# Patient Record
Sex: Male | Born: 1980 | Race: Asian | Hispanic: No | Marital: Married | Smoking: Former smoker
Health system: Southern US, Community
[De-identification: ages and names within clinical notes are randomized; demographics above are authoritative.]

## PROBLEM LIST (undated history)

## (undated) DIAGNOSIS — F1120 Opioid dependence, uncomplicated: Secondary | ICD-10-CM

## (undated) DIAGNOSIS — F1110 Opioid abuse, uncomplicated: Secondary | ICD-10-CM

## (undated) HISTORY — PX: TIBIA FRACTURE SURGERY: SHX806

---

## 2000-02-15 ENCOUNTER — Inpatient Hospital Stay (HOSPITAL_COMMUNITY): Admission: EM | Admit: 2000-02-15 | Discharge: 2000-03-02 | Payer: Self-pay

## 2000-02-15 ENCOUNTER — Encounter: Payer: Self-pay | Admitting: General Surgery

## 2000-02-16 ENCOUNTER — Encounter: Payer: Self-pay | Admitting: General Surgery

## 2000-02-18 ENCOUNTER — Encounter: Payer: Self-pay | Admitting: Anesthesiology

## 2000-06-04 ENCOUNTER — Ambulatory Visit (HOSPITAL_BASED_OUTPATIENT_CLINIC_OR_DEPARTMENT_OTHER): Admission: RE | Admit: 2000-06-04 | Discharge: 2000-06-04 | Payer: Self-pay | Admitting: Orthopedic Surgery

## 2000-09-03 ENCOUNTER — Inpatient Hospital Stay (HOSPITAL_COMMUNITY): Admission: RE | Admit: 2000-09-03 | Discharge: 2000-09-05 | Payer: Self-pay | Admitting: Orthopedic Surgery

## 2000-09-03 ENCOUNTER — Encounter: Payer: Self-pay | Admitting: Orthopedic Surgery

## 2005-04-03 HISTORY — PX: INGUINAL HERNIA REPAIR: SHX194

## 2005-06-29 ENCOUNTER — Emergency Department (HOSPITAL_COMMUNITY): Admission: EM | Admit: 2005-06-29 | Discharge: 2005-06-29 | Payer: Self-pay | Admitting: Emergency Medicine

## 2008-11-28 ENCOUNTER — Encounter: Admission: RE | Admit: 2008-11-28 | Discharge: 2008-11-28 | Payer: Self-pay | Admitting: Family Medicine

## 2008-12-21 ENCOUNTER — Ambulatory Visit (HOSPITAL_BASED_OUTPATIENT_CLINIC_OR_DEPARTMENT_OTHER): Admission: RE | Admit: 2008-12-21 | Discharge: 2008-12-21 | Payer: Self-pay | Admitting: Specialist

## 2010-11-11 LAB — POCT HEMOGLOBIN-HEMACUE: Hemoglobin: 15.6 g/dL (ref 13.0–17.0)
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
## 2011-09-17 ENCOUNTER — Encounter (HOSPITAL_COMMUNITY): Payer: Self-pay | Admitting: *Deleted

## 2011-09-17 ENCOUNTER — Emergency Department (HOSPITAL_COMMUNITY)
Admission: EM | Admit: 2011-09-17 | Discharge: 2011-09-17 | Disposition: A | Discharge status: Home / Self Care | Payer: Medicaid Other | Source: Home / Self Care | Attending: Family Medicine | Admitting: Family Medicine

## 2011-09-17 ENCOUNTER — Emergency Department (INDEPENDENT_AMBULATORY_CARE_PROVIDER_SITE_OTHER): Payer: Medicaid Other

## 2011-09-17 DIAGNOSIS — G8929 Other chronic pain: Secondary | ICD-10-CM

## 2011-09-17 DIAGNOSIS — M25569 Pain in unspecified knee: Secondary | ICD-10-CM

## 2011-09-17 HISTORY — DX: Rider (driver) (passenger) of other motorcycle injured in unspecified traffic accident, initial encounter: V29.99XA

## 2011-09-17 MED ORDER — OXYCODONE-ACETAMINOPHEN 5-325 MG PO TABS: ORAL_TABLET | ORAL | Status: AC
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
## 2011-09-17 NOTE — Discharge Instructions (Signed)
Take medications as directed. Please call [facility] to establish care with a primary care provider. Inform them of your insurance carrier Medicaid. Ask for a list of providers accept Medicaid. Also, I provided you with 2 podiatry offices. You may call them for further evaluation of the callus on your foot. First, you may try an over-the-counter product called Medi-plast. This is used to treat warts and calluses. It may take several weeks for the callus to completely resolve. However if this does not work, then you may seek evaluation at one of the podiatry offices. Again, these may take several weeks to resolve. They may use a variety of methods, such as freezing or shaving. You should also make an appointment with your previous orthopedic provider. Return to care should your symptoms not improve, or worsen in any way.
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
## 2011-11-23 ENCOUNTER — Other Ambulatory Visit: Payer: Self-pay | Admitting: Family Medicine

## 2011-11-23 ENCOUNTER — Ambulatory Visit
Admission: RE | Admit: 2011-11-23 | Discharge: 2011-11-23 | Disposition: A | Discharge status: Home / Self Care | Payer: Medicaid Other | Source: Ambulatory Visit | Attending: Family Medicine | Admitting: Family Medicine

## 2011-11-23 DIAGNOSIS — R52 Pain, unspecified: Secondary | ICD-10-CM

## 2011-11-23 DIAGNOSIS — R609 Edema, unspecified: Secondary | ICD-10-CM

## 2012-10-13 ENCOUNTER — Emergency Department (HOSPITAL_COMMUNITY)
Admission: EM | Admit: 2012-10-13 | Discharge: 2012-10-13 | Disposition: A | Discharge status: Home / Self Care | Payer: Medicaid Other | Attending: Emergency Medicine | Admitting: Emergency Medicine

## 2012-10-13 ENCOUNTER — Encounter (HOSPITAL_COMMUNITY): Payer: Self-pay | Admitting: *Deleted

## 2012-10-13 ENCOUNTER — Emergency Department (HOSPITAL_COMMUNITY): Payer: Medicaid Other

## 2012-10-13 DIAGNOSIS — Z87828 Personal history of other (healed) physical injury and trauma: Secondary | ICD-10-CM | POA: Insufficient documentation

## 2012-10-13 DIAGNOSIS — R51 Headache: Secondary | ICD-10-CM | POA: Insufficient documentation

## 2012-10-13 DIAGNOSIS — F17200 Nicotine dependence, unspecified, uncomplicated: Secondary | ICD-10-CM | POA: Insufficient documentation

## 2012-10-13 LAB — CBC WITH DIFFERENTIAL/PLATELET
Basophils Absolute: 0 K/uL (ref 0.0–0.1)
Basophils Relative: 0 % (ref 0–1)
Eosinophils Absolute: 0.2 K/uL (ref 0.0–0.7)
Eosinophils Relative: 2 % (ref 0–5)
HCT: 42.1 % (ref 39.0–52.0)
Hemoglobin: 14.4 g/dL (ref 13.0–17.0)
Lymphocytes Relative: 21 % (ref 12–46)
Lymphs Abs: 1.6 K/uL (ref 0.7–4.0)
MCH: 30.1 pg (ref 26.0–34.0)
MCHC: 34.2 g/dL (ref 30.0–36.0)
MCV: 88.1 fL (ref 78.0–100.0)
Monocytes Absolute: 0.3 K/uL (ref 0.1–1.0)
Monocytes Relative: 4 % (ref 3–12)
Neutro Abs: 5.5 K/uL (ref 1.7–7.7)
Neutrophils Relative %: 73 % (ref 43–77)
Platelets: 181 K/uL (ref 150–400)
RBC: 4.78 MIL/uL (ref 4.22–5.81)
RDW: 12.2 % (ref 11.5–15.5)
WBC: 7.5 K/uL (ref 4.0–10.5)

## 2012-10-13 LAB — SEDIMENTATION RATE: Sed Rate: 1 mm/hr (ref 0–16)

## 2012-10-13 LAB — POCT I-STAT, CHEM 8
HCT: 44 % (ref 39.0–52.0)
Hemoglobin: 15 g/dL (ref 13.0–17.0)
Potassium: 3.7 mEq/L (ref 3.5–5.1)
Sodium: 139 mEq/L (ref 135–145)

## 2012-10-13 MED ORDER — CARBAMAZEPINE ER 100 MG PO CP12: ORAL_CAPSULE | ORAL | Status: DC

## 2012-10-13 MED ORDER — KETOROLAC TROMETHAMINE 60 MG/2ML IM SOLN
60.0000 mg | Freq: Once | INTRAMUSCULAR | Status: AC
  Administered 2012-10-13: 60 mg via INTRAMUSCULAR
  Filled 2012-10-13: qty 2

## 2012-10-13 MED ORDER — METOCLOPRAMIDE HCL 5 MG/ML IJ SOLN
10.0000 mg | Freq: Once | INTRAMUSCULAR | Status: AC
  Administered 2012-10-13: 10 mg via INTRAVENOUS
  Filled 2012-10-13: qty 2

## 2012-10-13 MED ORDER — DIPHENHYDRAMINE HCL 50 MG/ML IJ SOLN
25.0000 mg | Freq: Once | INTRAMUSCULAR | Status: AC
  Administered 2012-10-13: 25 mg via INTRAVENOUS
  Filled 2012-10-13: qty 1

## 2012-10-13 MED ORDER — IOHEXOL 350 MG/ML SOLN
100.0000 mL | Freq: Once | INTRAVENOUS | Status: AC | PRN
  Administered 2012-10-13: 100 mL via INTRAVENOUS

## 2012-10-13 MED ORDER — CARBAMAZEPINE 200 MG PO TABS
200.0000 mg | ORAL_TABLET | Freq: Once | ORAL | Status: AC
  Administered 2012-10-13: 200 mg via ORAL
  Filled 2012-10-13: qty 1

## 2012-10-13 MED ORDER — OXYCODONE-ACETAMINOPHEN 10-325 MG PO TABS: 1.0000 | ORAL_TABLET | ORAL | Status: DC | PRN

## 2012-10-13 MED ORDER — OXYCODONE-ACETAMINOPHEN 5-325 MG PO TABS
1.0000 | ORAL_TABLET | Freq: Once | ORAL | Status: AC
  Administered 2012-10-13: 1 via ORAL
  Filled 2012-10-13: qty 1

## 2012-10-13 MED ORDER — DEXAMETHASONE SODIUM PHOSPHATE 10 MG/ML IJ SOLN
10.0000 mg | Freq: Once | INTRAMUSCULAR | Status: AC
  Administered 2012-10-13: 10 mg via INTRAVENOUS
  Filled 2012-10-13: qty 1

## 2012-10-13 MED ORDER — HYDROMORPHONE HCL PF 1 MG/ML IJ SOLN
1.0000 mg | Freq: Once | INTRAMUSCULAR | Status: AC
  Administered 2012-10-13: 1 mg via INTRAVENOUS
  Filled 2012-10-13: qty 1

## 2012-10-13 NOTE — ED Notes (Signed)
Pt c/o headache x 2 days; tonight pain right side of face below eye and right cheek area; equal facial grimacing; no numbness

## 2012-10-13 NOTE — Discharge Instructions (Signed)
You Have been seen today for your complaint of right-sided facial pain.  He will likely have a condition called trigeminal neuralgia.  Information is attached.  Please take the medications I have prescribed for you as directed.  He'll need to followup next week with your primary care doctor.  you will also need repeat lab work to look at your blood cell counts (a cbc w/ differential). Did not take Tylenol with the pain medication I prescribed you.  You should not drive or operate heavy machinery.  Do not combine it with alcohol.
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
## 2012-10-13 NOTE — ED Provider Notes (Addendum)
6:10 PM BP 107/67  Pulse 75  Temp(Src) 98 F (36.7 C) (Oral)  Resp 16  SpO2 99% Assumed care of the patient from PA Kirichenko.  Patient presents with complaint of severe pain in the trigeminal nerve distribution and behind the ear.  It is new onset and he has never this headache previously.  He is currently being ruled out for carotid dissection.  I have seen the patient and he has received dilaudid.  He still complains of severe pain.  He has never had the chickenpox to his knowledge and was born in [place].  He does complain of some blurriness in the right eye.  He also complains of pain in the temporal area.  No complaint of rash, itching or vesicular eruption.  No bruits over the temporal artery, pre-tragal, or carotid artery. There is no  Rash on physical exam within the ear.  Differential includes temporal arteritis, Ramsey-Hunt syndrome, trigeminal neuralgia/neuropathy.   8:07 AM BP 107/67  Pulse 75  Temp(Src) 98 F (36.7 C) (Oral)  Resp 16  SpO2 99% Patient labs show no elevation in his sedimentation rate.  CT is negative for carotid dissection or other acute abnormality.  Assume patient has trigeminal neuralgia and will treat with carbamazepine.  Patient should followup with primary care.   8:50 AM BP 107/67  Pulse 75  Temp(Src) 98 F (36.7 C) (Oral)  Resp 16  SpO2 99% Patient states that his pain is now approximately 7/10.  The patient will be discharged with carbamazepine tapering up  600 mg a day.  The patient should followup with his primary care physician this week and to be monitored for aplastic anemia or leukopenia as a result of the carbamazepine. discharge with pain medication and neurology followup.  At this time there does not appear to be any evidence of an acute emergency medical condition and the patient appears stable for discharge with appropriate outpatient follow up. Diagnosis was discussed with patient who verbalizes understanding and is agreeable to  discharge. Pt case discussed with Dr. Norlene Campbell who agrees with my plan.    Arthor Captain, PA-C 10/13/12 0852  Arthor Captain, PA-C 10/18/12 [phone]

## 2012-10-14 NOTE — ED Provider Notes (Signed)
Medical screening examination/treatment/procedure(s) were conducted as a shared visit with non-physician practitioner(s) and myself.  I personally evaluated the patient during the encounter.  Pt with right facial pain, headache.  No trauma, no fever, no prior h/o same.  Concern for possible carotid dissection, though less likely.  Suspect trigeminal neuralgia.  Olivia Mackie, MD 10/14/12 [phone]

## 2012-10-17 NOTE — ED Provider Notes (Signed)
Medical screening examination/treatment/procedure(s) were performed by non-physician practitioner and as supervising physician I was immediately available for consultation/collaboration.  Caterra Ostroff M Ameah Chanda, MD 10/17/12 1717 

## 2012-10-18 NOTE — ED Provider Notes (Signed)
Medical screening examination/treatment/procedure(s) were performed by non-physician practitioner and as supervising physician I was immediately available for consultation/collaboration.  Olivia Mackie, MD 10/18/12 [phone]

## 2012-11-01 ENCOUNTER — Telehealth: Payer: Self-pay | Admitting: Diagnostic Neuroimaging

## 2012-11-10 ENCOUNTER — Ambulatory Visit: Payer: Medicaid Other | Admitting: Diagnostic Neuroimaging

## 2012-11-12 NOTE — Telephone Encounter (Signed)
error 

## 2012-12-05 ENCOUNTER — Ambulatory Visit (INDEPENDENT_AMBULATORY_CARE_PROVIDER_SITE_OTHER): Payer: Medicaid Other | Admitting: Diagnostic Neuroimaging

## 2012-12-05 ENCOUNTER — Encounter: Payer: Self-pay | Admitting: Diagnostic Neuroimaging

## 2012-12-05 VITALS — BP 108/73 | HR 66 | Ht 68.0 in | Wt 131.0 lb

## 2012-12-05 DIAGNOSIS — G43909 Migraine, unspecified, not intractable, without status migrainosus: Secondary | ICD-10-CM

## 2012-12-05 DIAGNOSIS — H02401 Unspecified ptosis of right eyelid: Secondary | ICD-10-CM

## 2012-12-05 DIAGNOSIS — G501 Atypical facial pain: Secondary | ICD-10-CM

## 2012-12-05 DIAGNOSIS — R51 Headache: Secondary | ICD-10-CM

## 2012-12-05 DIAGNOSIS — H02409 Unspecified ptosis of unspecified eyelid: Secondary | ICD-10-CM

## 2012-12-05 MED ORDER — AMITRIPTYLINE HCL 25 MG PO TABS: 25.0000 mg | ORAL_TABLET | Freq: Every day | ORAL | Status: DC

## 2012-12-05 NOTE — Patient Instructions (Addendum)
I will order MRI brain, MRA head for further evaluation of your headaches and facial pain.  Try amitriptyline 25 mg every night before bedtime for at least 2-4 weeks. If this does not help, increase to 50 mg at night.
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
## 2013-01-09 ENCOUNTER — Other Ambulatory Visit: Payer: Medicaid Other

## 2013-01-13 ENCOUNTER — Ambulatory Visit
Admission: RE | Admit: 2013-01-13 | Discharge: 2013-01-13 | Disposition: A | Discharge status: Home / Self Care | Payer: Medicaid Other | Source: Ambulatory Visit | Attending: Diagnostic Neuroimaging | Admitting: Diagnostic Neuroimaging

## 2013-01-13 DIAGNOSIS — R51 Headache: Secondary | ICD-10-CM

## 2013-01-13 DIAGNOSIS — G43909 Migraine, unspecified, not intractable, without status migrainosus: Secondary | ICD-10-CM

## 2013-01-13 DIAGNOSIS — G501 Atypical facial pain: Secondary | ICD-10-CM

## 2013-01-19 NOTE — Progress Notes (Signed)
Quick Note:  Left a message on the pt's home voice mail regarding his recent MRI, MRA being within normal limits. Contact information was given so that he may call with any questions or concerns.   ______

## 2013-12-28 ENCOUNTER — Encounter: Payer: Self-pay | Admitting: Physical Medicine & Rehabilitation

## 2014-01-29 DIAGNOSIS — S61539A Puncture wound without foreign body of unspecified wrist, initial encounter: Secondary | ICD-10-CM | POA: Insufficient documentation

## 2014-02-09 DIAGNOSIS — G5600 Carpal tunnel syndrome, unspecified upper limb: Secondary | ICD-10-CM | POA: Insufficient documentation

## 2014-02-13 ENCOUNTER — Encounter: Payer: Self-pay | Admitting: Physical Medicine & Rehabilitation

## 2014-02-13 ENCOUNTER — Encounter: Payer: Medicaid Other | Attending: Physical Medicine & Rehabilitation

## 2014-02-13 ENCOUNTER — Ambulatory Visit (HOSPITAL_BASED_OUTPATIENT_CLINIC_OR_DEPARTMENT_OTHER): Payer: Medicaid Other | Admitting: Physical Medicine & Rehabilitation

## 2014-02-13 VITALS — BP 135/89 | HR 97 | Resp 14 | Ht 68.0 in | Wt 169.0 lb

## 2014-02-13 DIAGNOSIS — G8929 Other chronic pain: Secondary | ICD-10-CM | POA: Insufficient documentation

## 2014-02-13 DIAGNOSIS — M25569 Pain in unspecified knee: Secondary | ICD-10-CM | POA: Insufficient documentation

## 2014-02-13 DIAGNOSIS — G5780 Other specified mononeuropathies of unspecified lower limb: Secondary | ICD-10-CM | POA: Insufficient documentation

## 2014-02-13 DIAGNOSIS — F17200 Nicotine dependence, unspecified, uncomplicated: Secondary | ICD-10-CM | POA: Insufficient documentation

## 2014-02-13 DIAGNOSIS — G5782 Other specified mononeuropathies of left lower limb: Secondary | ICD-10-CM

## 2014-02-13 DIAGNOSIS — G8928 Other chronic postprocedural pain: Secondary | ICD-10-CM

## 2014-02-13 DIAGNOSIS — Z79899 Other long term (current) drug therapy: Secondary | ICD-10-CM

## 2014-02-13 DIAGNOSIS — M25562 Pain in left knee: Principal | ICD-10-CM

## 2014-02-13 DIAGNOSIS — Z5181 Encounter for therapeutic drug level monitoring: Secondary | ICD-10-CM

## 2014-02-13 MED ORDER — TRAMADOL HCL 50 MG PO TABS: 50.0000 mg | ORAL_TABLET | Freq: Four times a day (QID) | ORAL | Status: DC

## 2014-02-13 NOTE — Patient Instructions (Signed)
Need to check urine drug screen prior to any type of pain medicines that are stronger than tramadol
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
## 2014-03-26 ENCOUNTER — Ambulatory Visit: Payer: Medicaid Other | Admitting: Physical Medicine & Rehabilitation

## 2014-04-23 ENCOUNTER — Ambulatory Visit (HOSPITAL_BASED_OUTPATIENT_CLINIC_OR_DEPARTMENT_OTHER): Payer: Medicaid Other | Admitting: Physical Medicine & Rehabilitation

## 2014-04-23 ENCOUNTER — Encounter: Payer: Self-pay | Admitting: Physical Medicine & Rehabilitation

## 2014-04-23 ENCOUNTER — Encounter: Payer: Medicaid Other | Attending: Physical Medicine & Rehabilitation

## 2014-04-23 VITALS — HR 94 | Resp 14 | Wt 184.2 lb

## 2014-04-23 DIAGNOSIS — M25569 Pain in unspecified knee: Secondary | ICD-10-CM

## 2014-04-23 DIAGNOSIS — F17200 Nicotine dependence, unspecified, uncomplicated: Secondary | ICD-10-CM | POA: Insufficient documentation

## 2014-04-23 DIAGNOSIS — M25562 Pain in left knee: Principal | ICD-10-CM

## 2014-04-23 DIAGNOSIS — G8929 Other chronic pain: Secondary | ICD-10-CM | POA: Insufficient documentation

## 2014-04-23 MED ORDER — TRAMADOL HCL 50 MG PO TABS: 50.0000 mg | ORAL_TABLET | Freq: Four times a day (QID) | ORAL | Status: DC

## 2014-04-23 NOTE — Patient Instructions (Signed)
Knee Injection Joint injections are shots. Your caregiver will place a needle into your knee joint. The needle is used to put medicine into the joint. These shots can be used to help treat different painful knee conditions such as osteoarthritis, bursitis, local flare-ups of rheumatoid arthritis, and pseudogout. Anti-inflammatory medicines such as corticosteroids and anesthetics are the most common medicines used for joint and soft tissue injections.  PROCEDURE  The skin over the kneecap will be cleaned with an antiseptic solution.  Your caregiver will inject a small amount of a local anesthetic (a medicine like Novocaine) just under the skin in the area that was cleaned.  After the area becomes numb, a second injection is done. This second injection usually includes an anesthetic and an anti-inflammatory medicine called a steroid or cortisone. The needle is carefully placed in between the kneecap and the knee, and the medicine is injected into the joint space.  After the injection is done, the needle is removed. Your caregiver may place a bandage over the injection site. The whole procedure takes no more than a couple of minutes. BEFORE THE PROCEDURE  Wash all of the skin around the entire knee area. Try to remove any loose, scaling skin. There is no other specific preparation necessary unless advised otherwise by your caregiver. LET YOUR CAREGIVER KNOW ABOUT:   Allergies.  Medications taken including herbs, eye drops, over the counter medications, and creams.  Use of steroids (by mouth or creams).  Possible pregnancy, if applicable.  Previous problems with anesthetics or Novocaine.  History of blood clots (thrombophlebitis).  History of bleeding or blood problems.  Previous surgery.  Other health problems. RISKS AND COMPLICATIONS Side effects from cortisone shots are rare. They include:   Slight bruising of the skin.  Shrinkage of the normal fatty tissue under the skin where  the shot was given.  Increase in pain after the shot.  Infection.  Weakening of tendons or tendon rupture.  Allergic reaction to the medicine.  Diabetics may have a temporary increase in their blood sugar after a shot.  Cortisone can temporarily weaken the immune system. While receiving these shots, you should not get certain vaccines. Also, avoid contact with anyone who has chickenpox or measles. Especially if you have never had these diseases or have not been previously immunized. Your immune system may not be strong enough to fight off the infection while the cortisone is in your system. AFTER THE PROCEDURE   You can go home after the procedure.  You may need to put ice on the joint 15-20 minutes every 3 or 4 hours until the pain goes away.  You may need to put an elastic bandage on the joint. HOME CARE INSTRUCTIONS   Only take over-the-counter or prescription medicines for pain, discomfort, or fever as directed by your caregiver.  You should avoid stressing the joint. Unless advised otherwise, avoid activities that put a lot of pressure on a knee joint, such as:  Jogging.  Bicycling.  Recreational climbing.  Hiking.  Laying down and elevating the leg/knee above the level of your heart can help to minimize swelling. SEEK MEDICAL CARE IF:   You have repeated or worsening swelling.  There is drainage from the puncture area.  You develop red streaking that extends above or below the site where the needle was inserted. SEEK IMMEDIATE MEDICAL CARE IF:   You develop a fever.  You have pain that gets worse even though you are taking pain medicine.  The area is   red and warm, and you have trouble moving the joint. MAKE SURE YOU:   Understand these instructions.  Will watch your condition.  Will get help right away if you are not doing well or get worse. Document Released: 10/11/2006 Document Revised: 10/12/2011 Document Reviewed: 07/08/2007 ExitCare Patient  Information 2015 [employer]. This information is not intended to replace advice given to you by your health care provider. Make sure you discuss any questions you have with your health care provider.  

## 2014-04-23 NOTE — Progress Notes (Signed)
Knee injection Left  ultrasound guidance  Indication:Left Knee pain not relieved by medication management and other conservative care.  Informed consent was obtained after describing risks and benefits of the procedure with the patient, this includes bleeding, bruising, infection and medication side effects. The patient wishes to proceed and has given written consent. The patient was placed in a recumbent position. The medial aspect of the knee was marked and prepped with Betadine and alcohol. It was then entered with a 25-gauge 1-1/2 inch needle and 1 mL of 1% lidocaine was injected into the skin and subcutaneous tissue. This needle was advanced under direct ultrasound guidance into the joint capsule.. After negative draw back for blood, a solution containing one ML of  per mL betamethasone and 3 mL of 1% lidocaine were injected. The patient tolerated the procedure well. Post procedure instructions were given.  Will reassess in 2 months. If no results from this injection consider genicular nerve blocks If good results from this injection consider re\re injection, if only short term relief of 1-2 weeks consider Synvisc

## 2014-06-18 ENCOUNTER — Ambulatory Visit: Payer: Medicaid Other | Admitting: Physical Medicine & Rehabilitation

## 2014-06-18 ENCOUNTER — Encounter: Payer: Medicaid Other | Attending: Physical Medicine & Rehabilitation

## 2015-03-27 ENCOUNTER — Encounter (HOSPITAL_COMMUNITY): Payer: Self-pay

## 2015-03-27 ENCOUNTER — Emergency Department (HOSPITAL_COMMUNITY)
Admission: EM | Admit: 2015-03-27 | Discharge: 2015-03-27 | Disposition: A | Discharge status: Home / Self Care | Payer: Medicaid Other | Attending: Emergency Medicine | Admitting: Emergency Medicine

## 2015-03-27 DIAGNOSIS — G4700 Insomnia, unspecified: Secondary | ICD-10-CM | POA: Diagnosis not present

## 2015-03-27 DIAGNOSIS — Z720 Tobacco use: Secondary | ICD-10-CM | POA: Diagnosis not present

## 2015-03-27 DIAGNOSIS — Z008 Encounter for other general examination: Secondary | ICD-10-CM | POA: Diagnosis present

## 2015-03-27 DIAGNOSIS — Z87828 Personal history of other (healed) physical injury and trauma: Secondary | ICD-10-CM | POA: Diagnosis not present

## 2015-03-27 NOTE — ED Notes (Signed)
Pt presents stating "my wife thinks I need a psych eval" and insomnia x 1 month.  Sts "I start on one thing and then move to something else, but I think, she's the one that needs help.  She thinks I'm delusional, but I think, she is."  Pt reports this behavior has been going on around 1 month.  Denies SI/HI/AV.

## 2015-03-27 NOTE — ED Notes (Signed)
MD at bedside. 

## 2015-03-27 NOTE — Discharge Instructions (Signed)
°Emergency Department Resource Guide °1) Find a Doctor and Pay Out of Pocket °Although you won't have to find out who is covered by your insurance plan, it is a good idea to ask around and get recommendations. You will then need to call the office and see if the doctor you have chosen will accept you as a new patient and what types of options they offer for patients who are self-pay. Some doctors offer discounts or will set up payment plans for their patients who do not have insurance, but you will need to ask so you aren't surprised when you get to your appointment. ° °2) Contact Your Local Health Department °Not all health departments have doctors that can see patients for sick visits, but many do, so it is worth a call to see if yours does. If you don't know where your local health department is, you can check in your phone book. The CDC also has a tool to help you locate your state's health department, and many state websites also have listings of all of their local health departments. ° °3) Find a Walk-in Clinic °If your illness is not likely to be very severe or complicated, you may want to try a walk in clinic. These are popping up all over the country in pharmacies, drugstores, and shopping centers. They're usually staffed by nurse practitioners or physician assistants that have been trained to treat common illnesses and complaints. They're usually fairly quick and inexpensive. However, if you have serious medical issues or chronic medical problems, these are probably not your best option. ° °No Primary Care Doctor: °- Call Health Connect at  832-8000 - they can help you locate a primary care doctor that  accepts your insurance, provides certain services, etc. °- Physician Referral Service- 1-800-533-3463 ° °Chronic Pain Problems: °Organization         Address  Phone   Notes  °[facility]  (336) 297-2271 Patients need to be referred by their primary care doctor.  ° °Medication  Assistance: °Organization         Address  Phone   Notes  °[place] [facility] [address] °[place], [place] [address] (336) 641-8030 --Must be a resident of [place] °-- Must have NO insurance coverage whatsoever (no Medicaid/ Medicare, etc.) °-- The pt. MUST have a primary care doctor that directs their care regularly and follows them in the community °  °[facility]  (866) 331-1348   °[employer]  (888) 892-1162   ° °Agencies that provide inexpensive medical care: °Organization         Address  Phone   Notes  °[facility]  (336) 832-8035   °[facility]    (336) 832-7272   °[facility] [address] °[place], [place] [address] (336) 832-4777   °[facility] [address], °[place] (336) 271-4999   °[facility]    (336) 373-0678   °[facility]    (336) 272-1050   °[facility] ° [address], [place] Phone:  (336) 832-4444, Fax:  (336) 832-4440 Hours of Operation:  9 am - 6 pm, M-F.  Also accepts Medicaid/Medicare and self-pay.  °[facility] ° [address], [place] Phone: (336) 832-3150, Fax: (336) [phone]. Hours of Operation:  8:30 am - 5:30 pm, M-F.  Also accepts Medicaid and self-pay.  °[facility] [place] [address], [place] Phone: (336) 878-6027   °[facility] [address], [place], [place] (336)723-1848, Ext. 123 Mondays & Thursdays: 7-9 AM.  First 15 patients are seen on a first come, first serve basis. °  ° °Medicaid-accepting [place] Providers: ° °Organization         Address  Phone   Notes  °[facility] [address], [place] (336) 641-2100 Also accepts self-pay patients.  °[facility] [address], [place] ° (336) 856-9996   °[facility] [address], [place]  (336) 288-8857   °[facility] [address] [place] [address], [place] (336) 299-7000   °Veita Bland [address], [place]  ° (336) 373-1557 Only accepts [place] Access Medicaid patients after they have their name applied to their card.  ° °Self-Pay (no insurance) in [place]: ° °Organization         Address  Phone   Notes  °Sickle Cell Patients, [facility] [address], [place] (336) 832-1970   °[facility] [address], [place] (336) 832-4400   ° [facility] ° [address],  (336) 992-4800   °[facility]/Dr. Osei-Bonsu ° [address] [place] [address], [place] or [address], [place] (336) 841-8500 Phone number for both [place] and [place] locations is the same.  °[facility] [address], [place] (336) 299-0000   °[facility] [address] [place] [address], [place] or [address] (336) 852-7530 °(336) 878-2260   °[facility] [address], [place] (336) 350-1642, phone; (336) 294-5005, fax Sees patients 1st and 3rd Saturday of every month.  Must not qualify for public or private insurance (i.e. Medicaid, Medicare, [place] Health Choice, Veterans' Benefits) • Household income should be no more than 200% of the poverty level •The clinic cannot treat you if you are pregnant or think you are pregnant • Sexually transmitted diseases are not treated at the clinic.  ° ° °Dental Care: °Organization         Address  Phone  Notes  °[place] [facility] [address], [place] (336) 641-6152 Accepts children up to age 21 who are enrolled in Medicaid or [place] Health Choice; pregnant women with a Medicaid card; and children who have applied for Medicaid or [place] Health Choice, but were declined, whose parents can pay a reduced fee at time of service.  °[place]  [facility] [place]  [address], [place] (336) 641-7733 Accepts children up to age 21 who are enrolled in Medicaid or [place] Health Choice; pregnant women with a Medicaid card; and children who have applied for Medicaid or  Health Choice, but were declined, whose parents can pay a reduced fee at time of service.  °[facility] ° [address],  (336) 641-4533 Patients are seen by appointment only. Walk-ins are not accepted. [facility] will see patients 18 years of age and older. °Monday - Tuesday (8am-5pm) °Most Wednesdays (8:30-5pm) °$30 per visit, cash only  °[facility] ° [address], [place] (336) 641-4533 Patients are seen by appointment only. Walk-ins are not accepted. [facility] will see patients 18 years of age and older. °One   Wednesday Evening (Monthly: Volunteer Based).  $30 per visit, cash only  °[facility]  (919) 537-3737 for adults; Children under age 4, call Graduate Pediatric Dentistry at (919) [phone]. Children aged 4-14, please call (919) [phone] to request a pediatric application. ° Dental services are provided in all areas of dental care including fillings, crowns and bridges, complete and partial dentures, implants, gum treatment, root canals, and extractions. Preventive care is also provided. Treatment is provided to both adults and children. °Patients are selected via a lottery and there is often a waiting list. °  °[facility] [address], °[place] ° (336) 763-8833 [email or web address] °  °[facility] [address], [place], [place] (336)723-1848, Ext. 123 Second and Fourth Thursday of each month, opens at 6:30 AM; Clinic ends at 9 AM.  Patients are seen on a first-come first-served basis, and a limited number are seen during each clinic.  ° °[facility] ° [address], [place], [place] (336) 723-7904    Eligibility Requirements °You must have lived in [place], [place], or [place] counties for at least the last three months. °  You cannot be eligible for state or federal sponsored healthcare insurance, including Veterans Administration, Medicaid, or Medicare. °  You generally cannot be eligible for healthcare insurance through your employer.  °  How to apply: °Eligibility screenings are held every Tuesday and Wednesday afternoon from 1:00 pm until 4:00 pm. You do not need an appointment for the interview!  °[facility] [address], [place], [place] 336-631-2330   °[facility] [facility]  336-342-8273   °[facility]  336-703-3100   °[facility]  336-570-6415   ° °Behavioral Health Resources in the Community: °Intensive Outpatient Programs °Organization         Address  Phone  Notes  °[facility] [address], [place], [place] 336-878-6098   °[facility] [address], [place], [place] 336-832-9800   °[facility] [address], [place], [place] ° 336-882-2125   °[facility] [address],  °[place], [place] 1-800-853-5163 or 336-641-4981   °Substance Abuse Resources °Organization         Address  Phone  Notes  °[facility]  336-882-2125   °[facility]  336-784-9470   °[facility]  336-285-9073   °[facility]  336-845-3988   °Residential & Outpatient Substance Abuse Program  1-800-659-3381   °Psychological Services °Organization         Address  Phone  Notes  ° Health  336- 832-9600   °[facility]  336- 378-7881   °[facility] [address], [place] 1-800-853-5163 or 336-641-4981   ° °Mobile Crisis Teams °Organization         Address  Phone  Notes  °[facility], Mobile Crisis Care Unit  1-877-626-1772   °Assertive °[facility] ° [address]  [place], [place] 336-834-9664   °Sharon DeEsch [address] °[place] 336-554-5454   ° °Self-Help/Support Groups °Organization         Address  Phone             Notes  °[facility]  - variety of support groups  336- 373-1402 Call for more information  °Narcotics Anonymous (NA), [facility] [address], °[place]   2 meetings at this location  ° °  Residential Treatment Programs °Organization         Address  Phone  Notes  °[facility] [address],    °[place]  1-866-801-8205   °[facility] ° [address], [place], [place] 704-293-8524   °[facility] [address], [place] 336-845-3988 Admissions: 8am-3pm M-F  °[facility] [address]    °[place], [place] 336-841-1104   °[facility] [address], [place], [place] 336-379-7146   °[facility] [address]  °[place], [place] 336-285-9073   °[facility] Intensive Outpatient [address], [place], [place] 336-852-3033   °[facility] [address]  °[place], [place] 1-877-615-2722 or 336-784-9470   °[facility] [address] [place], [place] 336-227-7417 Accepts Medicaid  °[facility] [address]  ° [place] 1-800-659-3381 Substance Abuse/Addiction Treatment  ° °[place] Behavioral Health Resources °Organization         Address  Phone  Notes  °[facility]  (888) 581-9988   °Julie Brannon, PhD [address] [place], [place]   (336) 349-5553 or (336) 951-0000   °[facility]   [address] °[place], [place] (336) 349-4454   °[facility] [address], [place], [place] (336) 342-8316 Insurance/Medicaid/sponsorship through [facility]  °[facility] [address]                                    [place], [place] (336) 342-8316 Therapy/tele-psych/case    °[facility] [address]  ° [place], [place] (336) 349-2233    °Dr. Arfeen  (336) 349-4544   °[facility] [place]  [employer] [place] [facility] 1) [address],  °2) [address], [place] °3)  [address], [place] (336) 349-3220 °(336) 342-7768 ° °(336) 342-8140   °[place] Child Abuse Hotline (336) 342-1394 or (336) 342-3537 (After Hours)    ° ° °
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
## 2015-04-18 ENCOUNTER — Emergency Department (HOSPITAL_COMMUNITY): Payer: Medicaid Other

## 2015-04-18 ENCOUNTER — Emergency Department (HOSPITAL_COMMUNITY)
Admission: EM | Admit: 2015-04-18 | Discharge: 2015-04-18 | Disposition: A | Discharge status: Home / Self Care | Payer: Medicaid Other | Attending: Emergency Medicine | Admitting: Emergency Medicine

## 2015-04-18 ENCOUNTER — Encounter (HOSPITAL_COMMUNITY): Payer: Self-pay | Admitting: *Deleted

## 2015-04-18 DIAGNOSIS — Z87828 Personal history of other (healed) physical injury and trauma: Secondary | ICD-10-CM | POA: Diagnosis not present

## 2015-04-18 DIAGNOSIS — R1084 Generalized abdominal pain: Secondary | ICD-10-CM | POA: Diagnosis not present

## 2015-04-18 DIAGNOSIS — R52 Pain, unspecified: Secondary | ICD-10-CM | POA: Diagnosis present

## 2015-04-18 DIAGNOSIS — Z720 Tobacco use: Secondary | ICD-10-CM | POA: Diagnosis not present

## 2015-04-18 DIAGNOSIS — Z79891 Long term (current) use of opiate analgesic: Secondary | ICD-10-CM | POA: Diagnosis not present

## 2015-04-18 HISTORY — DX: Opioid dependence, uncomplicated: F11.20

## 2015-04-18 HISTORY — DX: Opioid abuse, uncomplicated: F11.10

## 2015-04-18 LAB — COMPREHENSIVE METABOLIC PANEL
ALK PHOS: 101 U/L (ref 38–126)
ALT: 38 U/L (ref 17–63)
ANION GAP: 6 (ref 5–15)
AST: 27 U/L (ref 15–41)
Albumin: 4.3 g/dL (ref 3.5–5.0)
BILIRUBIN TOTAL: 0.7 mg/dL (ref 0.3–1.2)
BUN: 12 mg/dL (ref 6–20)
CALCIUM: 9.2 mg/dL (ref 8.9–10.3)
CO2: 29 mmol/L (ref 22–32)
CREATININE: 0.87 mg/dL (ref 0.61–1.24)
Chloride: 106 mmol/L (ref 101–111)
GFR calc Af Amer: >60 mL/min (ref >60)
GFR calc non Af Amer: >60 mL/min (ref >60)
GLUCOSE: 94 mg/dL (ref 65–99)
Potassium: 3.5 mmol/L (ref 3.5–5.1)
SODIUM: 141 mmol/L (ref 135–145)
TOTAL PROTEIN: 7.3 g/dL (ref 6.5–8.1)

## 2015-04-18 LAB — CBC WITH DIFFERENTIAL/PLATELET
Basophils Absolute: 0 10*3/uL (ref 0.0–0.1)
Basophils Relative: 0 %
EOS ABS: 0.2 10*3/uL (ref 0.0–0.7)
Eosinophils Relative: 3 %
HEMATOCRIT: 45.6 % (ref 39.0–52.0)
HEMOGLOBIN: 15.2 g/dL (ref 13.0–17.0)
LYMPHS ABS: 1.3 10*3/uL (ref 0.7–4.0)
LYMPHS PCT: 20 %
MCH: 28.8 pg (ref 26.0–34.0)
MCHC: 33.3 g/dL (ref 30.0–36.0)
MCV: 86.4 fL (ref 78.0–100.0)
MONOS PCT: 5 %
Monocytes Absolute: 0.3 10*3/uL (ref 0.1–1.0)
NEUTROS ABS: 4.6 10*3/uL (ref 1.7–7.7)
NEUTROS PCT: 72 %
Platelets: 200 10*3/uL (ref 150–400)
RBC: 5.28 MIL/uL (ref 4.22–5.81)
RDW: 12.7 % (ref 11.5–15.5)
WBC: 6.4 10*3/uL (ref 4.0–10.5)

## 2015-04-18 LAB — URINALYSIS, ROUTINE W REFLEX MICROSCOPIC
BILIRUBIN URINE: NEGATIVE
GLUCOSE, UA: NEGATIVE mg/dL
HGB URINE DIPSTICK: NEGATIVE
Ketones, ur: NEGATIVE mg/dL
Leukocytes, UA: NEGATIVE
Nitrite: NEGATIVE
Protein, ur: NEGATIVE mg/dL
SPECIFIC GRAVITY, URINE: 1.016 (ref 1.005–1.030)
Urobilinogen, UA: 0.2 mg/dL (ref 0.0–1.0)
pH: 8 (ref 5.0–8.0)

## 2015-04-18 LAB — LIPASE, BLOOD: Lipase: 26 U/L (ref 22–51)

## 2015-04-18 NOTE — ED Notes (Signed)
Pt aware urine sample is needed 

## 2015-04-18 NOTE — ED Notes (Signed)
Pt presents via [facility] with c/o "not feeling right" and generalized body aches beginning this AM.  Pt reports going to methadone tx this AM and then called EMS after.  Denies any other drug/alcohol use.  Per EMS pupils 2 nonreactive.  EKG unremarkable, BP 122/86 P-80 R-16 CBG 128.  Pt denies fever.  Reports vomiting x 1 this AM.  Pt sleeping, NAD, oriented x 4.

## 2015-04-18 NOTE — ED Notes (Signed)
Patient transported to X-ray 
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
## 2015-04-18 NOTE — ED Notes (Signed)
PA at bedside.

## 2015-04-18 NOTE — Discharge Instructions (Signed)
You were evaluated in the ED today and there is not appear to be an emergent cause for your symptoms at this time. Your exam him a labs and x-rays are all reassuring. Please follow-up with your doctors as needed for reevaluation. Return to ED for worsening symptoms.  Pain of Unknown Etiology (Pain Without a Known Cause) You have come to your caregiver because of pain. Pain can occur in any part of the body. Often there is not a definite cause. If your laboratory (blood or urine) work was normal and X-rays or other studies were normal, your caregiver may treat you without knowing the cause of the pain. An example of this is the headache. Most headaches are diagnosed by taking a history. This means your caregiver asks you questions about your headaches. Your caregiver determines a treatment based on your answers. Usually testing done for headaches is normal. Often testing is not done unless there is no response to medications. Regardless of where your pain is located today, you can be given medications to make you comfortable. If no physical cause of pain can be found, most cases of pain will gradually leave as suddenly as they came.  If you have a painful condition and no reason can be found for the pain, it is important that you follow up with your caregiver. If the pain becomes worse or does not go away, it may be necessary to repeat tests and look further for a possible cause.  Only take over-the-counter or prescription medicines for pain, discomfort, or fever as directed by your caregiver.  For the protection of your privacy, test results cannot be given over the phone. Make sure you receive the results of your test. Ask how these results are to be obtained if you have not been informed. It is your responsibility to obtain your test results.  You may continue all activities unless the activities cause more pain. When the pain lessens, it is important to gradually resume normal activities. Resume  activities by beginning slowly and gradually increasing the intensity and duration of the activities or exercise. During periods of severe pain, bed rest may be helpful. Lie or sit in any position that is comfortable.  Ice used for acute (sudden) conditions may be effective. Use a large plastic bag filled with ice and wrapped in a towel. This may provide pain relief.  See your caregiver for continued problems. Your caregiver can help or refer you for exercises or physical therapy if necessary. If you were given medications for your condition, do not drive, operate machinery or power tools, or sign legal documents for 24 hours. Do not drink alcohol, take sleeping pills, or take other medications that may interfere with treatment. See your caregiver immediately if you have pain that is becoming worse and not relieved by medications. Document Released: 04/14/2001 Document Revised: 05/10/2013 Document Reviewed: 07/20/2005 [facility] Patient Information 2015 [place], [place]. This information is not intended to replace advice given to you by your health care provider. Make sure you discuss any questions you have with your health care provider.

## 2016-01-21 ENCOUNTER — Encounter (HOSPITAL_COMMUNITY): Payer: Self-pay

## 2016-01-21 ENCOUNTER — Inpatient Hospital Stay (HOSPITAL_COMMUNITY)
Admission: EM | Admit: 2016-01-21 | Discharge: 2016-01-24 | DRG: 442 | Disposition: A | Discharge status: Home / Self Care | Payer: Medicaid Other | Attending: Internal Medicine | Admitting: Internal Medicine

## 2016-01-21 ENCOUNTER — Emergency Department (HOSPITAL_COMMUNITY): Payer: Medicaid Other

## 2016-01-21 DIAGNOSIS — R1011 Right upper quadrant pain: Secondary | ICD-10-CM

## 2016-01-21 DIAGNOSIS — B1710 Acute hepatitis C without hepatic coma: Principal | ICD-10-CM | POA: Diagnosis present

## 2016-01-21 DIAGNOSIS — R7401 Elevation of levels of liver transaminase levels: Secondary | ICD-10-CM | POA: Diagnosis present

## 2016-01-21 DIAGNOSIS — K8040 Calculus of bile duct with cholecystitis, unspecified, without obstruction: Secondary | ICD-10-CM | POA: Diagnosis present

## 2016-01-21 DIAGNOSIS — K8050 Calculus of bile duct without cholangitis or cholecystitis without obstruction: Secondary | ICD-10-CM | POA: Diagnosis present

## 2016-01-21 DIAGNOSIS — R109 Unspecified abdominal pain: Secondary | ICD-10-CM | POA: Diagnosis present

## 2016-01-21 DIAGNOSIS — R740 Nonspecific elevation of levels of transaminase and lactic acid dehydrogenase [LDH]: Secondary | ICD-10-CM | POA: Diagnosis present

## 2016-01-21 DIAGNOSIS — F17210 Nicotine dependence, cigarettes, uncomplicated: Secondary | ICD-10-CM | POA: Diagnosis present

## 2016-01-21 DIAGNOSIS — R945 Abnormal results of liver function studies: Secondary | ICD-10-CM

## 2016-01-21 DIAGNOSIS — F1120 Opioid dependence, uncomplicated: Secondary | ICD-10-CM | POA: Diagnosis present

## 2016-01-21 DIAGNOSIS — Z720 Tobacco use: Secondary | ICD-10-CM | POA: Diagnosis present

## 2016-01-21 DIAGNOSIS — R935 Abnormal findings on diagnostic imaging of other abdominal regions, including retroperitoneum: Secondary | ICD-10-CM

## 2016-01-21 LAB — COMPREHENSIVE METABOLIC PANEL
ALT: 1113 U/L — AB (ref 17–63)
AST: 532 U/L — AB (ref 15–41)
Albumin: 3.8 g/dL (ref 3.5–5.0)
Alkaline Phosphatase: 229 U/L — ABNORMAL HIGH (ref 38–126)
Anion gap: 6 (ref 5–15)
BUN: 11 mg/dL (ref 6–20)
CHLORIDE: 105 mmol/L (ref 101–111)
CO2: 29 mmol/L (ref 22–32)
CREATININE: 0.87 mg/dL (ref 0.61–1.24)
Calcium: 8.6 mg/dL — ABNORMAL LOW (ref 8.9–10.3)
GFR calc Af Amer: >60 mL/min (ref >60)
GFR calc non Af Amer: >60 mL/min (ref >60)
GLUCOSE: 114 mg/dL — AB (ref 65–99)
Potassium: 3.5 mmol/L (ref 3.5–5.1)
SODIUM: 140 mmol/L (ref 135–145)
Total Bilirubin: 1.1 mg/dL (ref 0.3–1.2)
Total Protein: 7 g/dL (ref 6.5–8.1)

## 2016-01-21 LAB — CBC
HCT: 42 % (ref 39.0–52.0)
Hemoglobin: 14.3 g/dL (ref 13.0–17.0)
MCH: 29.1 pg (ref 26.0–34.0)
MCHC: 34 g/dL (ref 30.0–36.0)
MCV: 85.5 fL (ref 78.0–100.0)
PLATELETS: 196 10*3/uL (ref 150–400)
RBC: 4.91 MIL/uL (ref 4.22–5.81)
RDW: 12.8 % (ref 11.5–15.5)
WBC: 5.5 10*3/uL (ref 4.0–10.5)

## 2016-01-21 LAB — LIPASE, BLOOD: LIPASE: 22 U/L (ref 11–51)

## 2016-01-21 LAB — URINALYSIS, ROUTINE W REFLEX MICROSCOPIC
GLUCOSE, UA: NEGATIVE mg/dL
HGB URINE DIPSTICK: NEGATIVE
Ketones, ur: NEGATIVE mg/dL
Leukocytes, UA: NEGATIVE
Nitrite: NEGATIVE
PROTEIN: NEGATIVE mg/dL
Specific Gravity, Urine: 1.029 (ref 1.005–1.030)
pH: 6 (ref 5.0–8.0)

## 2016-01-21 MED ORDER — DIATRIZOATE MEGLUMINE & SODIUM 66-10 % PO SOLN
15.0000 mL | Freq: Once | ORAL | Status: AC
  Administered 2016-01-21: 15 mL via ORAL

## 2016-01-21 MED ORDER — SODIUM CHLORIDE 0.9 % IV SOLN
Freq: Once | INTRAVENOUS | Status: AC
  Administered 2016-01-21: via INTRAVENOUS

## 2016-01-21 MED ORDER — SODIUM CHLORIDE 0.9 % IV SOLN
Freq: Once | INTRAVENOUS | Status: AC
  Administered 2016-01-21: 20:00:00 via INTRAVENOUS

## 2016-01-21 MED ORDER — HYDROMORPHONE HCL 1 MG/ML IJ SOLN
1.0000 mg | Freq: Once | INTRAMUSCULAR | Status: AC
  Administered 2016-01-21: 1 mg via INTRAVENOUS
  Filled 2016-01-21: qty 1

## 2016-01-21 MED ORDER — ONDANSETRON HCL 4 MG/2ML IJ SOLN
4.0000 mg | Freq: Once | INTRAMUSCULAR | Status: AC
  Administered 2016-01-21: 4 mg via INTRAVENOUS
  Filled 2016-01-21: qty 2

## 2016-01-21 MED ORDER — IOPAMIDOL (ISOVUE-300) INJECTION 61%
100.0000 mL | Freq: Once | INTRAVENOUS | Status: AC | PRN
  Administered 2016-01-21: 100 mL via INTRAVENOUS

## 2016-01-21 NOTE — ED Notes (Signed)
Attempted x 2 for blood draw unsuccessful
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
## 2016-01-21 NOTE — ED Notes (Signed)
Attempted blood draw with no success.     
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
## 2016-01-21 NOTE — ED Notes (Signed)
Delay in bloodwork collection due to difficult stick

## 2016-01-21 NOTE — ED Notes (Signed)
Pt here with rt lower abdominal pain x 3 days.  N/V.  No fever.

## 2016-01-22 ENCOUNTER — Inpatient Hospital Stay (HOSPITAL_COMMUNITY): Payer: Medicaid Other

## 2016-01-22 ENCOUNTER — Encounter (HOSPITAL_COMMUNITY): Payer: Self-pay | Admitting: General Surgery

## 2016-01-22 DIAGNOSIS — F1120 Opioid dependence, uncomplicated: Secondary | ICD-10-CM | POA: Diagnosis present

## 2016-01-22 DIAGNOSIS — K8040 Calculus of bile duct with cholecystitis, unspecified, without obstruction: Secondary | ICD-10-CM | POA: Diagnosis present

## 2016-01-22 DIAGNOSIS — R740 Nonspecific elevation of levels of transaminase and lactic acid dehydrogenase [LDH]: Secondary | ICD-10-CM | POA: Diagnosis not present

## 2016-01-22 DIAGNOSIS — R1031 Right lower quadrant pain: Secondary | ICD-10-CM

## 2016-01-22 DIAGNOSIS — Z720 Tobacco use: Secondary | ICD-10-CM

## 2016-01-22 DIAGNOSIS — K8050 Calculus of bile duct without cholangitis or cholecystitis without obstruction: Secondary | ICD-10-CM | POA: Diagnosis not present

## 2016-01-22 DIAGNOSIS — R7401 Elevation of levels of liver transaminase levels: Secondary | ICD-10-CM | POA: Diagnosis present

## 2016-01-22 DIAGNOSIS — R109 Unspecified abdominal pain: Secondary | ICD-10-CM | POA: Diagnosis present

## 2016-01-22 DIAGNOSIS — B1710 Acute hepatitis C without hepatic coma: Secondary | ICD-10-CM | POA: Diagnosis present

## 2016-01-22 DIAGNOSIS — F1129 Opioid dependence with unspecified opioid-induced disorder: Secondary | ICD-10-CM | POA: Diagnosis not present

## 2016-01-22 DIAGNOSIS — F17210 Nicotine dependence, cigarettes, uncomplicated: Secondary | ICD-10-CM | POA: Diagnosis present

## 2016-01-22 LAB — PROTIME-INR
INR: 1.06 (ref 0.00–1.49)
Prothrombin Time: 14 seconds (ref 11.6–15.2)

## 2016-01-22 LAB — COMPREHENSIVE METABOLIC PANEL
ALK PHOS: 200 U/L — AB (ref 38–126)
ALT: 1005 U/L — AB (ref 17–63)
ANION GAP: 4 — AB (ref 5–15)
AST: 578 U/L — ABNORMAL HIGH (ref 15–41)
Albumin: 3.3 g/dL — ABNORMAL LOW (ref 3.5–5.0)
BILIRUBIN TOTAL: 1.2 mg/dL (ref 0.3–1.2)
BUN: 8 mg/dL (ref 6–20)
CALCIUM: 8.1 mg/dL — AB (ref 8.9–10.3)
CO2: 25 mmol/L (ref 22–32)
CREATININE: 0.72 mg/dL (ref 0.61–1.24)
Chloride: 111 mmol/L (ref 101–111)
GFR calc non Af Amer: >60 mL/min (ref >60)
GLUCOSE: 90 mg/dL (ref 65–99)
Potassium: 3.6 mmol/L (ref 3.5–5.1)
SODIUM: 140 mmol/L (ref 135–145)
TOTAL PROTEIN: 5.9 g/dL — AB (ref 6.5–8.1)

## 2016-01-22 LAB — CBC
HEMATOCRIT: 40.8 % (ref 39.0–52.0)
HEMOGLOBIN: 13.5 g/dL (ref 13.0–17.0)
MCH: 29 pg (ref 26.0–34.0)
MCHC: 33.1 g/dL (ref 30.0–36.0)
MCV: 87.6 fL (ref 78.0–100.0)
Platelets: 184 10*3/uL (ref 150–400)
RBC: 4.66 MIL/uL (ref 4.22–5.81)
RDW: 13.1 % (ref 11.5–15.5)
WBC: 4.7 10*3/uL (ref 4.0–10.5)

## 2016-01-22 LAB — APTT: aPTT: 34 seconds (ref 24–37)

## 2016-01-22 MED ORDER — SODIUM CHLORIDE 0.9 % IV SOLN
3.0000 g | Freq: Four times a day (QID) | INTRAVENOUS | Status: DC
  Administered 2016-01-22 – 2016-01-23 (×3): 3 g via INTRAVENOUS
  Filled 2016-01-22 (×5): qty 3

## 2016-01-22 MED ORDER — METHADONE HCL 10 MG/ML PO CONC
40.0000 mg | Freq: Every day | ORAL | Status: DC
  Administered 2016-01-22 – 2016-01-24 (×3): 40 mg via ORAL
  Filled 2016-01-22 (×4): qty 4

## 2016-01-22 MED ORDER — TECHNETIUM TC 99M MEBROFENIN IV KIT
5.0300 | PACK | Freq: Once | INTRAVENOUS | Status: AC | PRN
  Administered 2016-01-22: 5.03 via INTRAVENOUS

## 2016-01-22 MED ORDER — SODIUM CHLORIDE 0.9 % IV SOLN
3.0000 g | INTRAVENOUS | Status: AC
  Administered 2016-01-22: 3 g via INTRAVENOUS
  Filled 2016-01-22: qty 3

## 2016-01-22 MED ORDER — MORPHINE SULFATE (PF) 2 MG/ML IV SOLN
1.0000 mg | INTRAVENOUS | Status: DC | PRN
  Administered 2016-01-22 – 2016-01-23 (×4): 1 mg via INTRAVENOUS
  Filled 2016-01-22 (×4): qty 1

## 2016-01-22 MED ORDER — FAMOTIDINE IN NACL 20-0.9 MG/50ML-% IV SOLN
20.0000 mg | Freq: Two times a day (BID) | INTRAVENOUS | Status: DC
Start: 2016-01-22 — End: 2016-01-24
  Administered 2016-01-22 – 2016-01-23 (×4): 20 mg via INTRAVENOUS
  Filled 2016-01-22 (×5): qty 50

## 2016-01-22 MED ORDER — METHADONE HCL 10 MG PO TABS: 40.0000 mg | ORAL_TABLET | Freq: Once | ORAL | Status: DC

## 2016-01-22 MED ORDER — HYDROMORPHONE HCL 1 MG/ML IJ SOLN
1.0000 mg | INTRAMUSCULAR | Status: DC | PRN
Start: 2016-01-22 — End: 2016-01-22

## 2016-01-22 MED ORDER — MORPHINE SULFATE (PF) 2 MG/ML IV SOLN: 1.0000 mg | INTRAVENOUS | Status: DC | PRN

## 2016-01-22 MED ORDER — HEPARIN SODIUM (PORCINE) 5000 UNIT/ML IJ SOLN
5000.0000 [IU] | Freq: Three times a day (TID) | INTRAMUSCULAR | Status: DC
  Administered 2016-01-22 – 2016-01-24 (×5): 5000 [IU] via SUBCUTANEOUS
  Filled 2016-01-22 (×10): qty 1

## 2016-01-22 MED ORDER — ALBUTEROL SULFATE (2.5 MG/3ML) 0.083% IN NEBU: 2.5000 mg | INHALATION_SOLUTION | RESPIRATORY_TRACT | Status: DC | PRN

## 2016-01-22 MED ORDER — METHADONE HCL 10 MG/ML PO CONC: 40.0000 mg | Freq: Once | ORAL | Status: DC

## 2016-01-22 MED ORDER — ONDANSETRON HCL 4 MG/2ML IJ SOLN: 4.0000 mg | Freq: Four times a day (QID) | INTRAMUSCULAR | Status: DC | PRN

## 2016-01-22 MED ORDER — ONDANSETRON HCL 4 MG PO TABS: 4.0000 mg | ORAL_TABLET | Freq: Four times a day (QID) | ORAL | Status: DC | PRN

## 2016-01-22 MED ORDER — SODIUM CHLORIDE 0.9 % IV SOLN
INTRAVENOUS | Status: DC
Start: 2016-01-22 — End: 2016-01-24
  Administered 2016-01-22 – 2016-01-24 (×4): via INTRAVENOUS

## 2016-01-22 MED ORDER — HYDROMORPHONE HCL 1 MG/ML IJ SOLN
1.0000 mg | Freq: Once | INTRAMUSCULAR | Status: AC
  Administered 2016-01-22: 1 mg via INTRAVENOUS
  Filled 2016-01-22: qty 1
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
## 2016-01-22 NOTE — Progress Notes (Signed)
Pharmacy Antibiotic Note  Jim [name] is a 35 y.o. male admitted on 01/21/2016 with complaints of RUQ abdominal pain. Pharmacy has been consulted for Unasyn dosing for possible cholecystitis.  Plan: Unasyn 3g IV q6h. Need for further dosage adjustment appears unlikely at present, as patient has normal renal function and medication does not require adjustment in hepatic dysfunction.    Will sign off at this time.  Please reconsult if a change in clinical status warrants re-evaluation of dosage.  Height: 5\' 9"  (175.3 cm) Weight: 165 lb 5.5 oz (75 kg) IBW/kg (Calculated) : 70.7  Temp (24hrs), Avg:98.5 F (36.9 C), Min:98 F (36.7 C), Max:98.9 F (37.2 C)   Recent Labs Lab 01/21/16 1955 01/22/16 0540  WBC 5.5 4.7  CREATININE 0.87 0.72    Estimated Creatinine Clearance: 128.9 mL/min (by C-G formula based on Cr of 0.72).    No Known Allergies  Antimicrobials this admission: 6/21 >> Unasyn >>  Dose adjustments this admission: ---  Microbiology results: None ordered  Thank you for allowing pharmacy to be a part of this patient's care.   Greer PickerelJigna Jimi Giza, PharmD, BCPS Pager: [phone] 01/22/2016 9:52 AM

## 2016-01-22 NOTE — Progress Notes (Signed)
PROGRESS NOTE    Jim FrameSam N Medina  ATF:[id number] DOB: November 16, 1980 DOA: 01/21/2016 PCP: Karie ChimeraEESE,[name], MD  Brief Narrative: Jim [name] is a 35 y.o. 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 Describes pain as a sharp, achy, and crampy that he rates a 8 out of 10 at its worst. Symptoms initially were intermittent, but became more constant yesterday evening. Associated symptoms included nausea and vomiting of any food that he tried to eat. Pain worsened with any movement or food. Denies any fever, chills, chest pain, palpitations, shortness of breath, diarrhea, or rash. Patient denies having any previous abdominal surgeries. Currently, denies any alcohol or drug abuse. Patient smokes 3-4 cigarettes per day as in the process of trying to quit.  Assessment & Plan:   Principal Problem:   Abdominal pain Active Problems:   Choledocholithiasis   Opioid dependence (HCC)   Transaminitis   Tobacco abuse  1-Possible Acute vs Chronic Cholecystitis; Transaminases, RUQ abdominal pain.  Continue with NPO.  IV fluids.  Added IV Unasyn to cover for infeccion.  GI and general surgery consulted.  Hepatitis panel pending.   2-Opioid dependence  - continue methadone  3-Tobacco abuse : Patient currently is in the process of trying to quit.  - Counseled the patient on need of cessation  4-Screening for HIV.   DVT prophylaxis: scd, heparin  Code Status: full code.  Family Communication: care discussed with patient.  Disposition Plan: remain inpatient.    Consultants:   GI, [facility].  Surgery    Procedures:   none  Antimicrobials:  Unasyn 6-21   Subjective: Report abdominal pain, worse right upper quadrant, sharp.  Vomiting for  Las few days.    Objective: Filed Vitals:   01/22/16 0000 01/22/16 0100 01/22/16 0130 01/22/16 0254  BP: 121/81 111/74 112/77  125/89  Pulse: 70 70 61 57  Temp:    98.9 F (37.2 C)  TempSrc:    Oral  Resp:      SpO2: 99% 97% 97% 100%   No intake or output data in the 24 hours ending 01/22/16 0818 There were no vitals filed for this visit.  Examination:  General exam: Appears calm and comfortable  Respiratory system: Clear to auscultation. Respiratory effort normal. Cardiovascular system: S1 & S2 heard, RRR. No JVD, murmurs, rubs, gallops or clicks. No pedal edema. Gastrointestinal system: Abdomen is nondistended, soft , generalized tender worse RUQ. Marland Kitchen. No organomegaly or masses felt. Normal bowel sounds heard. Central nervous system: Alert and oriented. No focal neurological deficits. Extremities: Symmetric 5 x 5 power. Skin: No rashes, lesions or ulcers Psychiatry: Judgement and insight appear normal. Mood & affect appropriate.     Data Reviewed: I have personally reviewed following labs and imaging studies  CBC:  Recent Labs Lab 01/21/16 1955 01/22/16 0540  WBC 5.5 4.7  HGB 14.3 13.5  HCT 42.0 40.8  MCV 85.5 87.6  PLT 196 184   Basic Metabolic Panel:  Recent Labs Lab 01/21/16 1955 01/22/16 0540  NA 140 140  K 3.5 3.6  CL 105 111  CO2 29 25  GLUCOSE 114* 90  BUN 11 8  CREATININE 0.87 0.72  CALCIUM 8.6* 8.1*   GFR: CrCl cannot be calculated (Unknown ideal weight.). Liver Function Tests:  Recent Labs Lab 01/21/16 1955 01/22/16 0540  AST 532* 578*  ALT 1113* 1005*  ALKPHOS 229* 200*  BILITOT  1.1 1.2  PROT 7.0 5.9*  ALBUMIN 3.8 3.3*    Recent Labs Lab 01/21/16 1955  LIPASE 22   No results for input(s): AMMONIA in the last 168 hours. Coagulation Profile:  Recent Labs Lab 01/22/16 0540  INR 1.06   Cardiac Enzymes: No results for input(s): CKTOTAL, CKMB, CKMBINDEX, TROPONINI in the last 168 hours. BNP (last 3 results) No results for input(s): PROBNP in the last 8760 hours. HbA1C: No results for input(s): HGBA1C in the last 72 hours. CBG: No results for  input(s): GLUCAP in the last 168 hours. Lipid Profile: No results for input(s): CHOL, HDL, LDLCALC, TRIG, CHOLHDL, LDLDIRECT in the last 72 hours. Thyroid Function Tests: No results for input(s): TSH, T4TOTAL, FREET4, T3FREE, THYROIDAB in the last 72 hours. Anemia Panel: No results for input(s): VITAMINB12, FOLATE, FERRITIN, TIBC, IRON, RETICCTPCT in the last 72 hours. Sepsis Labs: No results for input(s): PROCALCITON, LATICACIDVEN in the last 168 hours.  No results found for this or any previous visit (from the past 240 hour(s)).       Radiology Studies: Ct Abdomen Pelvis W Contrast  01/21/2016  CLINICAL DATA:  35 year old male with right lower quadrant abdominal pain x3 days. EXAM: CT ABDOMEN AND PELVIS WITH CONTRAST TECHNIQUE: Multidetector CT imaging of the abdomen and pelvis was performed using the standard protocol following bolus administration of intravenous contrast. CONTRAST:  ISOVUE-300 IOPAMIDOL (ISOVUE-300) INJECTION 61% COMPARISON:  CT dated 06/29/2005 FINDINGS: The visualized lung bases are clear. No intra-abdominal free air or free fluid. Apparent diffuse fatty infiltration of the liver. There is diffuse thickened appearance of the gallbladder wall with enhancing gallbladder mucosa. No calcified gallstone identified. Ultrasound is recommended for better evaluation of the gallbladder. There is haziness of the fat in the portahepatis. There is no significant pericholecystic fluid. The pancreas, spleen, adrenal glands, kidneys , visualized ureters, and urinary bladder appear unremarkable. The prostate and seminal vesicles are grossly unremarkable. There is moderate stool throughout the colon. Multiple normal caliber fecalized loops of distal small bowel noted suggestive of chronic stasis. There is no evidence of bowel obstruction or active inflammation. Normal appendix. The abdominal aorta and IVC appear unremarkable. No portal venous gas identified. There is no adenopathy. The  abdominal wall soft tissues appear unremarkable. The osseous structures are intact. IMPRESSION: Diffuse thickened appearance of the gallbladder wall with mild haziness of the fat in the porta hepaticus. Findings concerning for acute versus less likely chronic cholecystitis. Ultrasound is recommended for better evaluation of gallbladder. Fatty liver. Constipation. No evidence of bowel obstruction or active inflammation. Normal appendix. Electronically Signed   By: Elgie Collard M.D.   On: 01/21/2016 21:22   US Abdomen Limited Ruq  01/22/2016  CLINICAL DATA:  Acute onset of right upper quadrant abdominal pain. Initial encounter. EXAM: US ABDOMEN LIMITED - RIGHT UPPER QUADRANT COMPARISON:  CT of the abdomen and pelvis performed earlier today at 9:13 p.m. FINDINGS: Gallbladder: Diffuse gallbladder wall thickening is noted, as seen on recent CT. This may reflect chronic inflammation or mild acute cholecystitis. Trace pericholecystic fluid is seen. No stones are identified. No ultrasonographic Murphy's sign is elicited. Common bile duct: Diameter: 0.9 cm proximally, though normal in caliber distally. Liver: No focal lesion identified. Diffusely increased parenchymal echogenicity is compatible with fatty infiltration. IMPRESSION: 1. Diffuse gallbladder wall thickening, with trace pericholecystic fluid. No stones identified, and no ultrasonographic Murphy's sign elicited. This may reflect chronic gallbladder inflammation or mild acute cholecystitis. 2. Common bile duct is prominent proximally, though normal  in caliber distally. This may remain within normal limits. 3. Diffuse fatty infiltration within the liver. Electronically Signed   By: Roanna Raider M.D.   On: 01/22/2016 00:40        Scheduled Meds: . methadone  40 mg Oral Daily   Continuous Infusions: . sodium chloride 125 mL/hr at 01/22/16 0254     LOS: 0 days    Time spent: 35 minutes.     Alba Cory, MD [facility] Pager  [phone]  If 7PM-7AM, please contact night-coverage [email or web address] Password [email or web address] 01/22/2016, 8:18 AM

## 2016-01-23 DIAGNOSIS — R740 Nonspecific elevation of levels of transaminase and lactic acid dehydrogenase [LDH]: Secondary | ICD-10-CM

## 2016-01-23 LAB — HEPATITIS PANEL, ACUTE
HCV Ab: >11 s/co ratio — ABNORMAL HIGH (ref 0.0–0.9)
HEP A IGM: NEGATIVE
HEP B C IGM: NEGATIVE
Hepatitis B Surface Ag: NEGATIVE

## 2016-01-23 LAB — COMPREHENSIVE METABOLIC PANEL
ALT: 1254 U/L — AB (ref 17–63)
AST: 866 U/L — ABNORMAL HIGH (ref 15–41)
Albumin: 3.2 g/dL — ABNORMAL LOW (ref 3.5–5.0)
Alkaline Phosphatase: 192 U/L — ABNORMAL HIGH (ref 38–126)
Anion gap: 5 (ref 5–15)
BILIRUBIN TOTAL: 1.9 mg/dL — AB (ref 0.3–1.2)
BUN: 7 mg/dL (ref 6–20)
CALCIUM: 8.2 mg/dL — AB (ref 8.9–10.3)
CHLORIDE: 111 mmol/L (ref 101–111)
CO2: 24 mmol/L (ref 22–32)
CREATININE: 0.64 mg/dL (ref 0.61–1.24)
Glucose, Bld: 75 mg/dL (ref 65–99)
Potassium: 3.5 mmol/L (ref 3.5–5.1)
Sodium: 140 mmol/L (ref 135–145)
TOTAL PROTEIN: 6 g/dL — AB (ref 6.5–8.1)

## 2016-01-23 NOTE — Progress Notes (Signed)
   HIDA negative for cholecystitis. No stones. +Hep C. Non surgical abdomen.  Please call [facility] for further assistance.  Ladarious Kresse, ANP-BC

## 2016-01-23 NOTE — Progress Notes (Signed)
PROGRESS NOTE    KOTA CIANCIO  RUE:[id number] DOB: 06/20/1981 DOA: 01/21/2016 PCP: Karie Chimera, MD  Brief Narrative: Jim [name] is a 35 y.o. 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 Describes pain as a sharp, achy, and crampy that he rates a 8 out of 10 at its worst. Symptoms initially were intermittent, but became more constant yesterday evening. Associated symptoms included nausea and vomiting of any food that he tried to eat. Pain worsened with any movement or food. Denies any fever, chills, chest pain, palpitations, shortness of breath, diarrhea, or rash. Patient denies having any previous abdominal surgeries. Currently, denies any alcohol or drug abuse. Patient smokes 3-4 cigarettes per day as in the process of trying to quit.  Assessment & Plan:   Principal Problem:   Abdominal pain Active Problems:   Choledocholithiasis   Opioid dependence (HCC)   Transaminitis   Tobacco abuse  1-Transaminases, RUQ abdominal pain. Hepatitis, hepatitis C positive.  Start diet today  IV fluids.  Discontinue Unasyn.  GI and general surgery consulted appreciate assistance,  Hepatitis pane; C positive,  LFT increasing.  HIDA scan negative,   2-Opioid dependence  - continue methadone  3-Tobacco abuse : Patient currently is in the process of trying to quit.  - Counseled the patient on need of cessation  4-Screening for HIV.   DVT prophylaxis: scd, heparin  Code Status: full code.  Family Communication: care discussed with patient.  Disposition Plan: remain inpatient.    Consultants:   GI, [facility].  Surgery    Procedures:   none  Antimicrobials:  Unasyn 6-21   Subjective: He is feeling better today, abdominal pain improved 5/10    Objective: Filed Vitals:   01/22/16 0800 01/22/16 2226 01/23/16 0437 01/23/16 1500  BP:  109/63 110/79  110/72  Pulse:  63 82 71  Temp:  98.6 F (37 C) 98.3 F (36.8 C) 98.8 F (37.1 C)  TempSrc:  Oral Oral Oral  Resp:  19 18 18   Height: 5\' 9"  (1.753 m)     Weight: 75 kg (165 lb 5.5 oz)     SpO2:  100% 97% 97%    Intake/Output Summary (Last 24 hours) at 01/23/16 1745 Last data filed at 01/23/16 1700  Gross per 24 hour  Intake 4922.5 ml  Output      0 ml  Net 4922.5 ml   Filed Weights   01/22/16 0800  Weight: 75 kg (165 lb 5.5 oz)    Examination:  General exam: Appears calm and comfortable  Respiratory system: Clear to auscultation. Respiratory effort normal. Cardiovascular system: S1 & S2 heard, RRR. No JVD, murmurs, rubs, gallops or clicks. No pedal edema. Gastrointestinal system: Abdomen is nondistended, soft , generalized tender worse RUQ. Marland Kitchen No organomegaly or masses felt. Normal bowel sounds heard. Central nervous system: Alert and oriented. No focal neurological deficits. Extremities: Symmetric 5 x 5 power. Skin: No rashes, lesions or ulcers Psychiatry: Judgement and insight appear normal. Mood & affect appropriate.     Data Reviewed: I have personally reviewed following labs and imaging studies  CBC:  Recent Labs Lab 01/21/16 1955 01/22/16 0540  WBC 5.5 4.7  HGB 14.3 13.5  HCT 42.0 40.8  MCV 85.5 87.6  PLT 196 184   Basic Metabolic Panel:  Recent Labs Lab 01/21/16 1955 01/22/16 0540 01/23/16 0434  NA 140 140 140  K 3.5 3.6 3.5  CL 105 111 111  CO2 29 25 24   GLUCOSE 114* 90 75  BUN 11 8 7   CREATININE 0.87 0.72 0.64  CALCIUM 8.6* 8.1* 8.2*   GFR: Estimated Creatinine Clearance: 128.9 mL/min (by C-G formula based on Cr of 0.64). Liver Function Tests:  Recent Labs Lab 01/21/16 1955 01/22/16 0540 01/23/16 0434  AST 532* 578* 866*  ALT 1113* 1005* 1254*  ALKPHOS 229* 200* 192*  BILITOT 1.1 1.2 1.9*  PROT 7.0 5.9* 6.0*  ALBUMIN 3.8 3.3* 3.2*    Recent Labs Lab 01/21/16 1955  LIPASE 22   No results for input(s): AMMONIA in the last  168 hours. Coagulation Profile:  Recent Labs Lab 01/22/16 0540  INR 1.06   Cardiac Enzymes: No results for input(s): CKTOTAL, CKMB, CKMBINDEX, TROPONINI in the last 168 hours. BNP (last 3 results) No results for input(s): PROBNP in the last 8760 hours. HbA1C: No results for input(s): HGBA1C in the last 72 hours. CBG: No results for input(s): GLUCAP in the last 168 hours. Lipid Profile: No results for input(s): CHOL, HDL, LDLCALC, TRIG, CHOLHDL, LDLDIRECT in the last 72 hours. Thyroid Function Tests: No results for input(s): TSH, T4TOTAL, FREET4, T3FREE, THYROIDAB in the last 72 hours. Anemia Panel: No results for input(s): VITAMINB12, FOLATE, FERRITIN, TIBC, IRON, RETICCTPCT in the last 72 hours. Sepsis Labs: No results for input(s): PROCALCITON, LATICACIDVEN in the last 168 hours.  No results found for this or any previous visit (from the past 240 hour(s)).       Radiology Studies: Nm Hepatobiliary Including Gb  01/22/2016  CLINICAL DATA:  RIGHT upper quadrant abdominal pain EXAM: NUCLEAR MEDICINE HEPATOBILIARY IMAGING TECHNIQUE: Sequential images of the abdomen were obtained out to 60 minutes following intravenous administration of radiopharmaceutical. Delayed images were not obtained. RADIOPHARMACEUTICALS:  5.03 mCi Tc-7335m  Choletec IV COMPARISON:  None FINDINGS: Normal tracer extraction from bloodstream indicating normal hepatocellular function. Normal excretion of tracer into biliary tree. Gallbladder first visualized at 12 min. At 1 hour at the conclusion of imaging, small bowel tracer was not visualized ; this prevents confirmation of patency of the CBD. IMPRESSION: Patent cystic duct. No evidence of acute cholecystitis. Electronically Signed   By: Ulyses SouthwardMark  Boles M.D.   On: 01/22/2016 16:24   Ct Abdomen Pelvis W Contrast  01/21/2016  CLINICAL DATA:  35 year old male with right lower quadrant abdominal pain x3 days. EXAM: CT ABDOMEN AND PELVIS WITH CONTRAST TECHNIQUE:  Multidetector CT imaging of the abdomen and pelvis was performed using the standard protocol following bolus administration of intravenous contrast. CONTRAST:  100mL ISOVUE-300 IOPAMIDOL (ISOVUE-300) INJECTION 61% COMPARISON:  CT dated 06/29/2005 FINDINGS: The visualized lung bases are clear. No intra-abdominal free air or free fluid. Apparent diffuse fatty infiltration of the liver. There is diffuse thickened appearance of the gallbladder wall with enhancing gallbladder mucosa. No calcified gallstone identified. Ultrasound is recommended for better evaluation of the gallbladder. There is haziness of the fat in the portahepatis. There is no significant pericholecystic fluid. The pancreas, spleen, adrenal glands, kidneys , visualized ureters, and urinary bladder appear unremarkable. The prostate and seminal vesicles are grossly unremarkable. There is moderate stool throughout the colon. Multiple normal caliber fecalized loops of distal small bowel noted suggestive of chronic stasis. There is no evidence of bowel obstruction or active inflammation. Normal appendix. The abdominal aorta and IVC appear unremarkable. No portal venous gas identified. There is no adenopathy. The abdominal wall soft tissues appear unremarkable. The osseous structures are intact.  IMPRESSION: Diffuse thickened appearance of the gallbladder wall with mild haziness of the fat in the porta hepaticus. Findings concerning for acute versus less likely chronic cholecystitis. Ultrasound is recommended for better evaluation of gallbladder. Fatty liver. Constipation. No evidence of bowel obstruction or active inflammation. Normal appendix. Electronically Signed   By: Elgie CollardArash  Radparvar M.D.   On: 01/21/2016 21:22   Mr Abdomen Mrcp Wo Cm  01/22/2016  CLINICAL DATA:  Upper abdominal pain. Epigastric and right upper quadrant. Elevated liver function tests. History of opiate abuse. EXAM: MRI ABDOMEN WITHOUT CONTRAST  (INCLUDING MRCP) TECHNIQUE: Multiplanar  multisequence MR imaging of the abdomen was performed. Heavily T2-weighted images of the biliary and pancreatic ducts were obtained, and three-dimensional MRCP images were rendered by post processing. COMPARISON:  Nuclear medicine study of 01/22/2016. Ultrasound of 01/21/2016. CT of 01/21/2016. FINDINGS: Lower chest: Normal heart size without pericardial or pleural effusion. Hepatobiliary: Mild hepatomegaly, 18.6 cm craniocaudal. Severe heterogeneous hepatic steatosis on series 9. Mild lateral segment left liver lobe and caudate lobe enlargement image 15/series 3. No focal liver lesion. Gallbladder wall thickening again identified, including at 7 mm on image 31/ series 3. No gallstones identified. The intrahepatic ducts are borderline prominent, including a 4 mm left hepatic duct. The common duct is upper normal to mildly dilated, including at 7 mm in the porta hepatis on image 51/series 5. Tapers gradually including at 5 mm in the pancreatic head on image 39/series 5. No choledocholithiasis identified. Pancreas: Normal, without mass or ductal dilatation. Spleen: Normal in size, without focal abnormality. Adrenals/Urinary Tract: Normal adrenal glands. Normal kidneys, without hydronephrosis. Stomach/Bowel: Normal stomach and abdominal bowel loops. Vascular/Lymphatic: Normal caliber of the aorta and branch vessels. Prominent porta hepatis nodes, including a 1.6 cm portacaval node on image 28/series 3. Other: New small volume perihepatic and right-sided abdominal ascites. Musculoskeletal: No acute osseous abnormality. IMPRESSION: 1. Patient was unable to finish the exam. No post-contrast imaging could be performed. 2. Heterogeneous hepatic steatosis. Prominent caudate and lateral segment left liver lobes with enlarged porta hepatis nodes. Although this the adenopathy could be related to steatosis, steatohepatitis and/or mild cirrhosis are concerns. Correlate with risk factors and consider serology. 3. Borderline  intrahepatic and borderline to mild proximal common duct dilatation, without stone or other cause identified. 4. Gallbladder wall thickening, which may be attributed to liver disease. No evidence of cholelithiasis or specific features of acute cholecystitis on prior ultrasound or nuclear medicine study. 5. Small volume new right upper abdominal ascites. Electronically Signed   By: Jeronimo GreavesKyle  Talbot M.D.   On: 01/22/2016 19:42   Mr 3d Recon At Scanner  01/22/2016  CLINICAL DATA:  Upper abdominal pain. Epigastric and right upper quadrant. Elevated liver function tests. History of opiate abuse. EXAM: MRI ABDOMEN WITHOUT CONTRAST  (INCLUDING MRCP) TECHNIQUE: Multiplanar multisequence MR imaging of the abdomen was performed. Heavily T2-weighted images of the biliary and pancreatic ducts were obtained, and three-dimensional MRCP images were rendered by post processing. COMPARISON:  Nuclear medicine study of 01/22/2016. Ultrasound of 01/21/2016. CT of 01/21/2016. FINDINGS: Lower chest: Normal heart size without pericardial or pleural effusion. Hepatobiliary: Mild hepatomegaly, 18.6 cm craniocaudal. Severe heterogeneous hepatic steatosis on series 9. Mild lateral segment left liver lobe and caudate lobe enlargement image 15/series 3. No focal liver lesion. Gallbladder wall thickening again identified, including at 7 mm on image 31/ series 3. No gallstones identified. The intrahepatic ducts are borderline prominent, including a 4 mm left hepatic duct. The common duct is upper normal to mildly  dilated, including at 7 mm in the porta hepatis on image 51/series 5. Tapers gradually including at 5 mm in the pancreatic head on image 39/series 5. No choledocholithiasis identified. Pancreas: Normal, without mass or ductal dilatation. Spleen: Normal in size, without focal abnormality. Adrenals/Urinary Tract: Normal adrenal glands. Normal kidneys, without hydronephrosis. Stomach/Bowel: Normal stomach and abdominal bowel loops.  Vascular/Lymphatic: Normal caliber of the aorta and branch vessels. Prominent porta hepatis nodes, including a 1.6 cm portacaval node on image 28/series 3. Other: New small volume perihepatic and right-sided abdominal ascites. Musculoskeletal: No acute osseous abnormality. IMPRESSION: 1. Patient was unable to finish the exam. No post-contrast imaging could be performed. 2. Heterogeneous hepatic steatosis. Prominent caudate and lateral segment left liver lobes with enlarged porta hepatis nodes. Although this the adenopathy could be related to steatosis, steatohepatitis and/or mild cirrhosis are concerns. Correlate with risk factors and consider serology. 3. Borderline intrahepatic and borderline to mild proximal common duct dilatation, without stone or other cause identified. 4. Gallbladder wall thickening, which may be attributed to liver disease. No evidence of cholelithiasis or specific features of acute cholecystitis on prior ultrasound or nuclear medicine study. 5. Small volume new right upper abdominal ascites. Electronically Signed   By: Jeronimo Greaves M.D.   On: 01/22/2016 19:42   US Abdomen Limited Ruq  01/22/2016  CLINICAL DATA:  Acute onset of right upper quadrant abdominal pain. Initial encounter. EXAM: US ABDOMEN LIMITED - RIGHT UPPER QUADRANT COMPARISON:  CT of the abdomen and pelvis performed earlier today at 9:13 p.m. FINDINGS: Gallbladder: Diffuse gallbladder wall thickening is noted, as seen on recent CT. This may reflect chronic inflammation or mild acute cholecystitis. Trace pericholecystic fluid is seen. No stones are identified. No ultrasonographic Murphy's sign is elicited. Common bile duct: Diameter: 0.9 cm proximally, though normal in caliber distally. Liver: No focal lesion identified. Diffusely increased parenchymal echogenicity is compatible with fatty infiltration. IMPRESSION: 1. Diffuse gallbladder wall thickening, with trace pericholecystic fluid. No stones identified, and no  ultrasonographic Murphy's sign elicited. This may reflect chronic gallbladder inflammation or mild acute cholecystitis. 2. Common bile duct is prominent proximally, though normal in caliber distally. This may remain within normal limits. 3. Diffuse fatty infiltration within the liver. Electronically Signed   By: Roanna Raider M.D.   On: 01/22/2016 00:40        Scheduled Meds: . famotidine (PEPCID) IV  20 mg Intravenous Q12H  . heparin subcutaneous  5,000 Units Subcutaneous Q8H  . methadone  40 mg Oral Daily   Continuous Infusions: . sodium chloride 75 mL/hr at 01/23/16 0919     LOS: 1 day    Time spent: 35 minutes.     Alba Cory, MD [facility] Pager [phone]  If 7PM-7AM, please contact night-coverage [email or web address] Password [facility] 01/23/2016, 5:45 PM

## 2016-01-23 NOTE — Progress Notes (Signed)
[facility] Progress Note  Subjective: The patient still has some right upper quadrant abdominal pain. It is tolerable when he receives pain medicine. There is no evidence of acute cholecystitis or choledocholithiasis. He does have a positive hepatitis C antibody. Hepatitis A and hepatitis B serology negative. Imaging also shows fatty liver area  Objective: Vital signs in last 24 hours: Temp:  [98.3 F (36.8 C)-98.6 F (37 C)] 98.3 F (36.8 C) (06/22 0437) Pulse Rate:  [63-82] 82 (06/22 0437) Resp:  [18-19] 18 (06/22 0437) BP: (109-110)/(63-79) 110/79 mmHg (06/22 0437) SpO2:  [97 %-100 %] 97 % (06/22 0437) Weight change:    PE:  No distress  Heart regular rhythm  Lungs clear  Abdomen soft there is some right upper quadrant discomfort  Lab Results: Results for orders placed or performed during the hospital encounter of 01/21/16 (from the past 24 hour(s))  Comprehensive metabolic panel     Status: Abnormal   Collection Time: 01/23/16  4:34 AM  Result Value Ref Range   Sodium 140 135 - 145 mmol/L   Potassium 3.5 3.5 - 5.1 mmol/L   Chloride 111 101 - 111 mmol/L   CO2 24 22 - 32 mmol/L   Glucose, Bld 75 65 - 99 mg/dL   BUN 7 6 - 20 mg/dL   Creatinine, Ser 7.82 0.61 - 1.24 mg/dL   Calcium 8.2 (L) 8.9 - 10.3 mg/dL   Total Protein 6.0 (L) 6.5 - 8.1 g/dL   Albumin 3.2 (L) 3.5 - 5.0 g/dL   AST 956 (H) 15 - 41 U/L   ALT 1254 (H) 17 - 63 U/L   Alkaline Phosphatase 192 (H) 38 - 126 U/L   Total Bilirubin 1.9 (H) 0.3 - 1.2 mg/dL   GFR calc non Af Amer >60 >60 mL/min   GFR calc Af Amer >60 >60 mL/min   Anion gap 5 5 - 15    Studies/Results: Nm Hepatobiliary Including Gb  01/22/2016  CLINICAL DATA:  RIGHT upper quadrant abdominal pain EXAM: NUCLEAR MEDICINE HEPATOBILIARY IMAGING TECHNIQUE: Sequential images of the abdomen were obtained out to 60 minutes following intravenous administration of radiopharmaceutical. Delayed images were not obtained. RADIOPHARMACEUTICALS:   5.03 mCi Tc-42m  Choletec IV COMPARISON:  None FINDINGS: Normal tracer extraction from bloodstream indicating normal hepatocellular function. Normal excretion of tracer into biliary tree. Gallbladder first visualized at 12 min. At 1 hour at the conclusion of imaging, small bowel tracer was not visualized ; this prevents confirmation of patency of the CBD. IMPRESSION: Patent cystic duct. No evidence of acute cholecystitis. Electronically Signed   By: Ulyses Southward M.D.   On: 01/22/2016 16:24   Mr Abdomen Mrcp Wo Cm  01/22/2016  CLINICAL DATA:  Upper abdominal pain. Epigastric and right upper quadrant. Elevated liver function tests. History of opiate abuse. EXAM: MRI ABDOMEN WITHOUT CONTRAST  (INCLUDING MRCP) TECHNIQUE: Multiplanar multisequence MR imaging of the abdomen was performed. Heavily T2-weighted images of the biliary and pancreatic ducts were obtained, and three-dimensional MRCP images were rendered by post processing. COMPARISON:  Nuclear medicine study of 01/22/2016. Ultrasound of 01/21/2016. CT of 01/21/2016. FINDINGS: Lower chest: Normal heart size without pericardial or pleural effusion. Hepatobiliary: Mild hepatomegaly, 18.6 cm craniocaudal. Severe heterogeneous hepatic steatosis on series 9. Mild lateral segment left liver lobe and caudate lobe enlargement image 15/series 3. No focal liver lesion. Gallbladder wall thickening again identified, including at 7 mm on image 31/ series 3. No gallstones identified. The intrahepatic ducts are borderline prominent, including a 4 mm  left hepatic duct. The common duct is upper normal to mildly dilated, including at 7 mm in the porta hepatis on image 51/series 5. Tapers gradually including at 5 mm in the pancreatic head on image 39/series 5. No choledocholithiasis identified. Pancreas: Normal, without mass or ductal dilatation. Spleen: Normal in size, without focal abnormality. Adrenals/Urinary Tract: Normal adrenal glands. Normal kidneys, without  hydronephrosis. Stomach/Bowel: Normal stomach and abdominal bowel loops. Vascular/Lymphatic: Normal caliber of the aorta and branch vessels. Prominent porta hepatis nodes, including a 1.6 cm portacaval node on image 28/series 3. Other: New small volume perihepatic and right-sided abdominal ascites. Musculoskeletal: No acute osseous abnormality. IMPRESSION: 1. Patient was unable to finish the exam. No post-contrast imaging could be performed. 2. Heterogeneous hepatic steatosis. Prominent caudate and lateral segment left liver lobes with enlarged porta hepatis nodes. Although this the adenopathy could be related to steatosis, steatohepatitis and/or mild cirrhosis are concerns. Correlate with risk factors and consider serology. 3. Borderline intrahepatic and borderline to mild proximal common duct dilatation, without stone or other cause identified. 4. Gallbladder wall thickening, which may be attributed to liver disease. No evidence of cholelithiasis or specific features of acute cholecystitis on prior ultrasound or nuclear medicine study. 5. Small volume new right upper abdominal ascites. Electronically Signed   By: Jeronimo GreavesKyle  Talbot M.D.   On: 01/22/2016 19:42   Mr 3d Recon At Scanner  01/22/2016  CLINICAL DATA:  Upper abdominal pain. Epigastric and right upper quadrant. Elevated liver function tests. History of opiate abuse. EXAM: MRI ABDOMEN WITHOUT CONTRAST  (INCLUDING MRCP) TECHNIQUE: Multiplanar multisequence MR imaging of the abdomen was performed. Heavily T2-weighted images of the biliary and pancreatic ducts were obtained, and three-dimensional MRCP images were rendered by post processing. COMPARISON:  Nuclear medicine study of 01/22/2016. Ultrasound of 01/21/2016. CT of 01/21/2016. FINDINGS: Lower chest: Normal heart size without pericardial or pleural effusion. Hepatobiliary: Mild hepatomegaly, 18.6 cm craniocaudal. Severe heterogeneous hepatic steatosis on series 9. Mild lateral segment left liver lobe and  caudate lobe enlargement image 15/series 3. No focal liver lesion. Gallbladder wall thickening again identified, including at 7 mm on image 31/ series 3. No gallstones identified. The intrahepatic ducts are borderline prominent, including a 4 mm left hepatic duct. The common duct is upper normal to mildly dilated, including at 7 mm in the porta hepatis on image 51/series 5. Tapers gradually including at 5 mm in the pancreatic head on image 39/series 5. No choledocholithiasis identified. Pancreas: Normal, without mass or ductal dilatation. Spleen: Normal in size, without focal abnormality. Adrenals/Urinary Tract: Normal adrenal glands. Normal kidneys, without hydronephrosis. Stomach/Bowel: Normal stomach and abdominal bowel loops. Vascular/Lymphatic: Normal caliber of the aorta and branch vessels. Prominent porta hepatis nodes, including a 1.6 cm portacaval node on image 28/series 3. Other: New small volume perihepatic and right-sided abdominal ascites. Musculoskeletal: No acute osseous abnormality. IMPRESSION: 1. Patient was unable to finish the exam. No post-contrast imaging could be performed. 2. Heterogeneous hepatic steatosis. Prominent caudate and lateral segment left liver lobes with enlarged porta hepatis nodes. Although this the adenopathy could be related to steatosis, steatohepatitis and/or mild cirrhosis are concerns. Correlate with risk factors and consider serology. 3. Borderline intrahepatic and borderline to mild proximal common duct dilatation, without stone or other cause identified. 4. Gallbladder wall thickening, which may be attributed to liver disease. No evidence of cholelithiasis or specific features of acute cholecystitis on prior ultrasound or nuclear medicine study. 5. Small volume new right upper abdominal ascites. Electronically Signed   By: Ronaldo MiyamotoKyle  Reche Dixonalbot M.D.   On: 01/22/2016 19:42      Assessment: Acute hepatitis  Positive hepatitis C antibody  Fatty liver.  No signs of acute  cholecystitis or choledocholithiasis  Plan:   Continue supportive care. Follow clinically. Advance diet as tolerated.    Gwenevere AbbotSAM F Aitanna Haubner 01/23/2016, 9:25 AM  Pager: [phone] If no answer or after 5 PM call [phone] Lab Results  Component Value Date   HGB 13.5 01/22/2016   HGB 14.3 01/21/2016   HGB 15.2 04/18/2015   HCT 40.8 01/22/2016   HCT 42.0 01/21/2016   HCT 45.6 04/18/2015   ALKPHOS 192* 01/23/2016   ALKPHOS 200* 01/22/2016   ALKPHOS 229* 01/21/2016   AST 866* 01/23/2016   AST 578* 01/22/2016   AST 532* 01/21/2016   ALT 1254* 01/23/2016   ALT 1005* 01/22/2016   ALT 1113* 01/21/2016

## 2016-01-24 DIAGNOSIS — F1129 Opioid dependence with unspecified opioid-induced disorder: Secondary | ICD-10-CM

## 2016-01-24 LAB — CBC
HEMATOCRIT: 41 % (ref 39.0–52.0)
HEMOGLOBIN: 13.5 g/dL (ref 13.0–17.0)
MCH: 28.7 pg (ref 26.0–34.0)
MCHC: 32.9 g/dL (ref 30.0–36.0)
MCV: 87 fL (ref 78.0–100.0)
Platelets: 206 10*3/uL (ref 150–400)
RBC: 4.71 MIL/uL (ref 4.22–5.81)
RDW: 13.2 % (ref 11.5–15.5)
WBC: 4.4 10*3/uL (ref 4.0–10.5)

## 2016-01-24 LAB — COMPREHENSIVE METABOLIC PANEL
ALBUMIN: 3.2 g/dL — AB (ref 3.5–5.0)
ALT: 1222 U/L — ABNORMAL HIGH (ref 17–63)
ANION GAP: 5 (ref 5–15)
AST: 663 U/L — ABNORMAL HIGH (ref 15–41)
Alkaline Phosphatase: 205 U/L — ABNORMAL HIGH (ref 38–126)
BILIRUBIN TOTAL: 2.1 mg/dL — AB (ref 0.3–1.2)
BUN: 6 mg/dL (ref 6–20)
CO2: 26 mmol/L (ref 22–32)
Calcium: 8.4 mg/dL — ABNORMAL LOW (ref 8.9–10.3)
Chloride: 108 mmol/L (ref 101–111)
Creatinine, Ser: 0.75 mg/dL (ref 0.61–1.24)
GFR calc Af Amer: >60 mL/min (ref >60)
GFR calc non Af Amer: >60 mL/min (ref >60)
GLUCOSE: 127 mg/dL — AB (ref 65–99)
Potassium: 3.6 mmol/L (ref 3.5–5.1)
SODIUM: 139 mmol/L (ref 135–145)
TOTAL PROTEIN: 5.9 g/dL — AB (ref 6.5–8.1)

## 2016-01-24 MED ORDER — FAMOTIDINE 20 MG PO TABS
20.0000 mg | ORAL_TABLET | Freq: Two times a day (BID) | ORAL | Status: DC
  Administered 2016-01-24: 20 mg via ORAL
  Filled 2016-01-24 (×2): qty 1

## 2016-01-24 MED ORDER — TRAMADOL HCL 50 MG PO TABS: 50.0000 mg | ORAL_TABLET | Freq: Four times a day (QID) | ORAL | Status: DC | PRN

## 2016-01-24 NOTE — Discharge Instructions (Signed)
Follow up with your PCP, you need lab work early next week to follow on your liver function.  Do not take tylenol. Do not drink alcohol.

## 2016-01-24 NOTE — Discharge Summary (Signed)
Physician Discharge Summary  Jim [name] ZOX:[id number] DOB: [birth year] DOA: 01/21/2016  PCP: Karie ChimeraEESE,[name], MD  Admit date: 01/21/2016 Discharge date: 01/24/2016  Admitted From: Home  Disposition:  Discharge home   Recommendations for Outpatient Follow-up:  1. Follow up with PCP in 1-2 weeks 2. Please obtain BMP/CBC in one week 3. Please follow up on the following pending results: HIV, Hepatitis C PCR quantitative.  4. Needs referral ID clinic for hepatitis C.   Home Health: no  Equipment/Devices: none  Discharge Condition: stable.  CODE STATUS: Full code.  Diet recommendation: Heart Healthy  Brief/Interim Summary: 1-Acute hepatitis C.  Transaminases, RUQ abdominal pain. Hepatitis, hepatitis C positive.  Tolerating diet  Discontinue Unasyn.  GI and general surgery consulted appreciate assistance,  Hepatitis pane; C positive,  LFT stabilizing  HIDA scan negative,  PCR hepatitis C ordered.  Needs referral ID clinic  Will provide short course of tramadol.   2-Opioid dependence  - continue methadone  3-Tobacco abuse : Patient currently is in the process of trying to quit.  - Counseled the patient on need of cessation  4-Screening for HIV ordered. Marland Kitchen.   Discharge Diagnoses:    Acute Hepatitis C   Choledocholithiasis   Opioid dependence (HCC)   Transaminitis   Tobacco abuse    Discharge Instructions  Discharge Instructions    Diet - low sodium heart healthy    Complete by:  As directed      Increase activity slowly    Complete by:  As directed             Medication List    STOP taking these medications        naproxen sodium 220 MG tablet  Commonly known as:  ANAPROX     oxyCODONE-acetaminophen 10-325 MG tablet  Commonly known as:  PERCOCET      TAKE these medications        ibuprofen 200 MG tablet  Commonly known as:  ADVIL,MOTRIN  Take 400 mg by mouth every 6 (six) hours as needed for moderate pain.     methadone 10 MG/ML solution   Commonly known as:  DOLOPHINE  Take 40 mg by mouth daily.     traMADol 50 MG tablet  Commonly known as:  ULTRAM  Take 1 tablet (50 mg total) by mouth every 6 (six) hours as needed for moderate pain.           Follow-up Information    Follow up with REESE,[name], MD In 1 week.   Specialty:  Family Medicine   Contact information:   [address] [place] [address] [phone]      No Known Allergies  Consultations:  DR Evette CristalGanem    Procedures/Studies: Nm Hepatobiliary Including Gb  01/22/2016  CLINICAL DATA:  RIGHT upper quadrant abdominal pain EXAM: NUCLEAR MEDICINE HEPATOBILIARY IMAGING TECHNIQUE: Sequential images of the abdomen were obtained out to 60 minutes following intravenous administration of radiopharmaceutical. Delayed images were not obtained. RADIOPHARMACEUTICALS:  5.03 mCi Tc-5029m  Choletec IV COMPARISON:  None FINDINGS: Normal tracer extraction from bloodstream indicating normal hepatocellular function. Normal excretion of tracer into biliary tree. Gallbladder first visualized at 12 min. At 1 hour at the conclusion of imaging, small bowel tracer was not visualized ; this prevents confirmation of patency of the CBD. IMPRESSION: Patent cystic duct. No evidence of acute cholecystitis. Electronically Signed   By: Ulyses SouthwardMark  Boles M.D.   On: 01/22/2016 16:24   Ct Abdomen Pelvis W Contrast  01/21/2016  CLINICAL DATA:  35 year old male with right lower quadrant abdominal pain x3 days. EXAM: CT ABDOMEN AND PELVIS WITH CONTRAST TECHNIQUE: Multidetector CT imaging of the abdomen and pelvis was performed using the standard protocol following bolus administration of intravenous contrast. CONTRAST:  ISOVUE-300 IOPAMIDOL (ISOVUE-300) INJECTION 61% COMPARISON:  CT dated 06/29/2005 FINDINGS: The visualized lung bases are clear. No intra-abdominal free air or free fluid. Apparent diffuse fatty infiltration of the liver. There is diffuse thickened appearance of the  gallbladder wall with enhancing gallbladder mucosa. No calcified gallstone identified. Ultrasound is recommended for better evaluation of the gallbladder. There is haziness of the fat in the portahepatis. There is no significant pericholecystic fluid. The pancreas, spleen, adrenal glands, kidneys , visualized ureters, and urinary bladder appear unremarkable. The prostate and seminal vesicles are grossly unremarkable. There is moderate stool throughout the colon. Multiple normal caliber fecalized loops of distal small bowel noted suggestive of chronic stasis. There is no evidence of bowel obstruction or active inflammation. Normal appendix. The abdominal aorta and IVC appear unremarkable. No portal venous gas identified. There is no adenopathy. The abdominal wall soft tissues appear unremarkable. The osseous structures are intact. IMPRESSION: Diffuse thickened appearance of the gallbladder wall with mild haziness of the fat in the porta hepaticus. Findings concerning for acute versus less likely chronic cholecystitis. Ultrasound is recommended for better evaluation of gallbladder. Fatty liver. Constipation. No evidence of bowel obstruction or active inflammation. Normal appendix. Electronically Signed   By: Elgie Collard M.D.   On: 01/21/2016 21:22   Mr Abdomen Mrcp Wo Cm  01/22/2016  CLINICAL DATA:  Upper abdominal pain. Epigastric and right upper quadrant. Elevated liver function tests. History of opiate abuse. EXAM: MRI ABDOMEN WITHOUT CONTRAST  (INCLUDING MRCP) TECHNIQUE: Multiplanar multisequence MR imaging of the abdomen was performed. Heavily T2-weighted images of the biliary and pancreatic ducts were obtained, and three-dimensional MRCP images were rendered by post processing. COMPARISON:  Nuclear medicine study of 01/22/2016. Ultrasound of 01/21/2016. CT of 01/21/2016. FINDINGS: Lower chest: Normal heart size without pericardial or pleural effusion. Hepatobiliary: Mild hepatomegaly, 18.6 cm  craniocaudal. Severe heterogeneous hepatic steatosis on series 9. Mild lateral segment left liver lobe and caudate lobe enlargement image 15/series 3. No focal liver lesion. Gallbladder wall thickening again identified, including at 7 mm on image 31/ series 3. No gallstones identified. The intrahepatic ducts are borderline prominent, including a 4 mm left hepatic duct. The common duct is upper normal to mildly dilated, including at 7 mm in the porta hepatis on image 51/series 5. Tapers gradually including at 5 mm in the pancreatic head on image 39/series 5. No choledocholithiasis identified. Pancreas: Normal, without mass or ductal dilatation. Spleen: Normal in size, without focal abnormality. Adrenals/Urinary Tract: Normal adrenal glands. Normal kidneys, without hydronephrosis. Stomach/Bowel: Normal stomach and abdominal bowel loops. Vascular/Lymphatic: Normal caliber of the aorta and branch vessels. Prominent porta hepatis nodes, including a 1.6 cm portacaval node on image 28/series 3. Other: New small volume perihepatic and right-sided abdominal ascites. Musculoskeletal: No acute osseous abnormality. IMPRESSION: 1. Patient was unable to finish the exam. No post-contrast imaging could be performed. 2. Heterogeneous hepatic steatosis. Prominent caudate and lateral segment left liver lobes with enlarged porta hepatis nodes. Although this the adenopathy could be related to steatosis, steatohepatitis and/or mild cirrhosis are concerns. Correlate with risk factors and consider serology. 3. Borderline intrahepatic and borderline to mild proximal common duct dilatation, without stone or other cause identified. 4. Gallbladder wall thickening, which may be attributed  to liver disease. No evidence of cholelithiasis or specific features of acute cholecystitis on prior ultrasound or nuclear medicine study. 5. Small volume new right upper abdominal ascites. Electronically Signed   By: Jeronimo Greaves M.D.   On: 01/22/2016 19:42    Mr 3d Recon At Scanner  01/22/2016  CLINICAL DATA:  Upper abdominal pain. Epigastric and right upper quadrant. Elevated liver function tests. History of opiate abuse. EXAM: MRI ABDOMEN WITHOUT CONTRAST  (INCLUDING MRCP) TECHNIQUE: Multiplanar multisequence MR imaging of the abdomen was performed. Heavily T2-weighted images of the biliary and pancreatic ducts were obtained, and three-dimensional MRCP images were rendered by post processing. COMPARISON:  Nuclear medicine study of 01/22/2016. Ultrasound of 01/21/2016. CT of 01/21/2016. FINDINGS: Lower chest: Normal heart size without pericardial or pleural effusion. Hepatobiliary: Mild hepatomegaly, 18.6 cm craniocaudal. Severe heterogeneous hepatic steatosis on series 9. Mild lateral segment left liver lobe and caudate lobe enlargement image 15/series 3. No focal liver lesion. Gallbladder wall thickening again identified, including at 7 mm on image 31/ series 3. No gallstones identified. The intrahepatic ducts are borderline prominent, including a 4 mm left hepatic duct. The common duct is upper normal to mildly dilated, including at 7 mm in the porta hepatis on image 51/series 5. Tapers gradually including at 5 mm in the pancreatic head on image 39/series 5. No choledocholithiasis identified. Pancreas: Normal, without mass or ductal dilatation. Spleen: Normal in size, without focal abnormality. Adrenals/Urinary Tract: Normal adrenal glands. Normal kidneys, without hydronephrosis. Stomach/Bowel: Normal stomach and abdominal bowel loops. Vascular/Lymphatic: Normal caliber of the aorta and branch vessels. Prominent porta hepatis nodes, including a 1.6 cm portacaval node on image 28/series 3. Other: New small volume perihepatic and right-sided abdominal ascites. Musculoskeletal: No acute osseous abnormality. IMPRESSION: 1. Patient was unable to finish the exam. No post-contrast imaging could be performed. 2. Heterogeneous hepatic steatosis. Prominent caudate and  lateral segment left liver lobes with enlarged porta hepatis nodes. Although this the adenopathy could be related to steatosis, steatohepatitis and/or mild cirrhosis are concerns. Correlate with risk factors and consider serology. 3. Borderline intrahepatic and borderline to mild proximal common duct dilatation, without stone or other cause identified. 4. Gallbladder wall thickening, which may be attributed to liver disease. No evidence of cholelithiasis or specific features of acute cholecystitis on prior ultrasound or nuclear medicine study. 5. Small volume new right upper abdominal ascites. Electronically Signed   By: Jeronimo Greaves M.D.   On: 01/22/2016 19:42   US Abdomen Limited Ruq  01/22/2016  CLINICAL DATA:  Acute onset of right upper quadrant abdominal pain. Initial encounter. EXAM: US ABDOMEN LIMITED - RIGHT UPPER QUADRANT COMPARISON:  CT of the abdomen and pelvis performed earlier today at 9:13 p.m. FINDINGS: Gallbladder: Diffuse gallbladder wall thickening is noted, as seen on recent CT. This may reflect chronic inflammation or mild acute cholecystitis. Trace pericholecystic fluid is seen. No stones are identified. No ultrasonographic Murphy's sign is elicited. Common bile duct: Diameter: 0.9 cm proximally, though normal in caliber distally. Liver: No focal lesion identified. Diffusely increased parenchymal echogenicity is compatible with fatty infiltration. IMPRESSION: 1. Diffuse gallbladder wall thickening, with trace pericholecystic fluid. No stones identified, and no ultrasonographic Murphy's sign elicited. This may reflect chronic gallbladder inflammation or mild acute cholecystitis. 2. Common bile duct is prominent proximally, though normal in caliber distally. This may remain within normal limits. 3. Diffuse fatty infiltration within the liver. Electronically Signed   By: Roanna Raider M.D.   On: 01/22/2016 00:40  Subjective: Feeling better, RUQ pain improved.   Discharge  Exam: Filed Vitals:   01/23/16 2106 01/24/16 0503  BP: 118/75 118/81  Pulse: 74 75  Temp: 98.7 F (37.1 C) 98.9 F (37.2 C)  Resp: 18 16   Filed Vitals:   01/23/16 0437 01/23/16 1500 01/23/16 2106 01/24/16 0503  BP: 110/79 110/72 118/75 118/81  Pulse: 82 71 74 75  Temp: 98.3 F (36.8 C) 98.8 F (37.1 C) 98.7 F (37.1 C) 98.9 F (37.2 C)  TempSrc: Oral Oral Oral Oral  Resp: 18 18 18 16   Height:      Weight:      SpO2: 97% 97% 98% 96%    General: Pt is alert, awake, not in acute distress Cardiovascular: RRR, S1/S2 +, no rubs, no gallops Respiratory: CTA bilaterally, no wheezing, no rhonchi Abdominal: Soft, NT, ND, bowel sounds + Extremities: no edema, no cyanosis    The results of significant diagnostics from this hospitalization (including imaging, microbiology, ancillary and laboratory) are listed below for reference.     Microbiology: No results found for this or any previous visit (from the past 240 hour(s)).   Labs: BNP (last 3 results) No results for input(s): BNP in the last 8760 hours. Basic Metabolic Panel:  Recent Labs Lab 01/21/16 1955 01/22/16 0540 01/23/16 0434 01/24/16 0508  NA 140 140 140 139  K 3.5 3.6 3.5 3.6  CL 105 111 111 108  CO2 29 25 24 26   GLUCOSE 114* 90 75 127*  BUN 11 8 7 6   CREATININE 0.87 0.72 0.64 0.75  CALCIUM 8.6* 8.1* 8.2* 8.4*   Liver Function Tests:  Recent Labs Lab 01/21/16 1955 01/22/16 0540 01/23/16 0434 01/24/16 0508  AST 532* 578* 866* 663*  ALT 1113* 1005* 1254* 1222*  ALKPHOS 229* 200* 192* 205*  BILITOT 1.1 1.2 1.9* 2.1*  PROT 7.0 5.9* 6.0* 5.9*  ALBUMIN 3.8 3.3* 3.2* 3.2*    Recent Labs Lab 01/21/16 1955  LIPASE 22   No results for input(s): AMMONIA in the last 168 hours. CBC:  Recent Labs Lab 01/21/16 1955 01/22/16 0540 01/24/16 0508  WBC 5.5 4.7 4.4  HGB 14.3 13.5 13.5  HCT 42.0 40.8 41.0  MCV 85.5 87.6 87.0  PLT 196 184 206   Cardiac Enzymes: No results for input(s): CKTOTAL,  CKMB, CKMBINDEX, TROPONINI in the last 168 hours. BNP: Invalid input(s): POCBNP CBG: No results for input(s): GLUCAP in the last 168 hours. D-Dimer No results for input(s): DDIMER in the last 72 hours. Hgb A1c No results for input(s): HGBA1C in the last 72 hours. Lipid Profile No results for input(s): CHOL, HDL, LDLCALC, TRIG, CHOLHDL, LDLDIRECT in the last 72 hours. Thyroid function studies No results for input(s): TSH, T4TOTAL, T3FREE, THYROIDAB in the last 72 hours.  Invalid input(s): FREET3 Anemia work up No results for input(s): VITAMINB12, FOLATE, FERRITIN, TIBC, IRON, RETICCTPCT in the last 72 hours. Urinalysis    Component Value Date/Time   COLORURINE AMBER* 01/21/2016 2040   APPEARANCEUR CLEAR 01/21/2016 2040   LABSPEC 1.029 01/21/2016 2040   PHURINE 6.0 01/21/2016 2040   GLUCOSEU NEGATIVE 01/21/2016 2040   HGBUR NEGATIVE 01/21/2016 2040   BILIRUBINUR MODERATE* 01/21/2016 2040   KETONESUR NEGATIVE 01/21/2016 2040   PROTEINUR NEGATIVE 01/21/2016 2040   UROBILINOGEN 0.2 04/18/2015 1135   NITRITE NEGATIVE 01/21/2016 2040   LEUKOCYTESUR NEGATIVE 01/21/2016 2040   Sepsis Labs Invalid input(s): PROCALCITONIN,  WBC,  LACTICIDVEN Microbiology No results found for this or any previous visit (from the  past 240 hour(s)).   Time coordinating discharge: Over 30 minutes  SIGNED:   Alba Cory, MD  [facility] 01/24/2016, 9:57 AM Pager [phone]  If 7PM-7AM, please contact night-coverage [email or web address] Password [email or web address]

## 2016-01-24 NOTE — Progress Notes (Signed)
Discharge planning, no HH needs identified. [phone] 

## 2016-01-24 NOTE — Progress Notes (Addendum)
PROGRESS NOTE    Jim [name]  JYN:[id number] DOB: [birth year] DOA: 01/21/2016 PCP: Karie Chimera, MD  Brief Narrative: Jim [name] is a 35 y.o. 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 Describes pain as a sharp, achy, and crampy that he rates a 8 out of 10 at its worst. Symptoms initially were intermittent, but became more constant yesterday evening. Associated symptoms included nausea and vomiting of any food that he tried to eat. Pain worsened with any movement or food. Denies any fever, chills, chest pain, palpitations, shortness of breath, diarrhea, or rash. Patient denies having any previous abdominal surgeries. Currently, denies any alcohol or drug abuse. Patient smokes 3-4 cigarettes per day as in the process of trying to quit.  Assessment & Plan:   Principal Problem:   Abdominal pain Active Problems:   Choledocholithiasis   Opioid dependence (HCC)   Transaminitis   Tobacco abuse  1-Transaminases, RUQ abdominal pain. Hepatitis, hepatitis C positive.  Tolerating diet  Discontinue Unasyn.  GI and general surgery consulted appreciate assistance,  Hepatitis pane; C positive,  LFT stabilizing  HIDA scan negative,  PCR hepatitis C ordered.   2-Opioid dependence  - continue methadone  3-Tobacco abuse : Patient currently is in the process of trying to quit.  - Counseled the patient on need of cessation  4-Screening for HIV ordered. .   DVT prophylaxis: scd, heparin  Code Status: full code.  Family Communication: care discussed with patient.  Disposition Plan: remain inpatient.    Consultants:   GI, [facility].  Surgery    Procedures:   none  Antimicrobials:  Unasyn 6-21   Subjective: Tolerating diet, eating well.    Objective: Filed Vitals:   01/23/16 0437 01/23/16 1500 01/23/16 2106 01/24/16 0503  BP: 110/79 110/72 118/75  118/81  Pulse: 82 71 74 75  Temp: 98.3 F (36.8 C) 98.8 F (37.1 C) 98.7 F (37.1 C) 98.9 F (37.2 C)  TempSrc: Oral Oral Oral Oral  Resp: 18 18 18 16   Height:      Weight:      SpO2: 97% 97% 98% 96%    Intake/Output Summary (Last 24 hours) at 01/24/16 0952 Last data filed at 01/23/16 1900  Gross per 24 hour  Intake   1400 ml  Output      0 ml  Net   1400 ml   Filed Weights   01/22/16 0800  Weight: 75 kg (165 lb 5.5 oz)    Examination:  General exam: Appears calm and comfortable  Respiratory system: Clear to auscultation. Respiratory effort normal. Cardiovascular system: S1 & S2 heard, RRR. No JVD, murmurs, rubs, gallops or clicks. No pedal edema. Gastrointestinal system: Abdomen is nondistended, soft , generalized tender worse RUQ. Marland Kitchen No organomegaly or masses felt. Normal bowel sounds heard. Central nervous system: Alert and oriented. No focal neurological deficits. Extremities: Symmetric 5 x 5 power. Skin: No rashes, lesions or ulcers Psychiatry: Judgement and insight appear normal. Mood & affect appropriate.     Data Reviewed: I have personally reviewed following labs and imaging studies  CBC:  Recent Labs Lab 01/21/16 1955 01/22/16 0540 01/24/16 0508  WBC 5.5 4.7 4.4  HGB 14.3 13.5 13.5  HCT 42.0 40.8 41.0  MCV 85.5 87.6 87.0  PLT 196 184 206   Basic Metabolic Panel:  Recent Labs Lab 01/21/16 1955 01/22/16 0540 01/23/16 0434 01/24/16 8657  NA 140 140 140 139  K 3.5 3.6 3.5 3.6  CL 105 111 111 108  CO2 29 25 24 26   GLUCOSE 114* 90 75 127*  BUN 11 8 7 6   CREATININE 0.87 0.72 0.64 0.75  CALCIUM 8.6* 8.1* 8.2* 8.4*   GFR: Estimated Creatinine Clearance: 128.9 mL/min (by C-G formula based on Cr of 0.75). Liver Function Tests:  Recent Labs Lab 01/21/16 1955 01/22/16 0540 01/23/16 0434 01/24/16 0508  AST 532* 578* 866* 663*  ALT 1113* 1005* 1254* 1222*  ALKPHOS 229* 200* 192* 205*  BILITOT 1.1 1.2 1.9* 2.1*  PROT 7.0 5.9* 6.0* 5.9*    ALBUMIN 3.8 3.3* 3.2* 3.2*    Recent Labs Lab 01/21/16 1955  LIPASE 22   No results for input(s): AMMONIA in the last 168 hours. Coagulation Profile:  Recent Labs Lab 01/22/16 0540  INR 1.06   Cardiac Enzymes: No results for input(s): CKTOTAL, CKMB, CKMBINDEX, TROPONINI in the last 168 hours. BNP (last 3 results) No results for input(s): PROBNP in the last 8760 hours. HbA1C: No results for input(s): HGBA1C in the last 72 hours. CBG: No results for input(s): GLUCAP in the last 168 hours. Lipid Profile: No results for input(s): CHOL, HDL, LDLCALC, TRIG, CHOLHDL, LDLDIRECT in the last 72 hours. Thyroid Function Tests: No results for input(s): TSH, T4TOTAL, FREET4, T3FREE, THYROIDAB in the last 72 hours. Anemia Panel: No results for input(s): VITAMINB12, FOLATE, FERRITIN, TIBC, IRON, RETICCTPCT in the last 72 hours. Sepsis Labs: No results for input(s): PROCALCITON, LATICACIDVEN in the last 168 hours.  No results found for this or any previous visit (from the past 240 hour(s)).       Radiology Studies: Nm Hepatobiliary Including Gb  01/22/2016  CLINICAL DATA:  RIGHT upper quadrant abdominal pain EXAM: NUCLEAR MEDICINE HEPATOBILIARY IMAGING TECHNIQUE: Sequential images of the abdomen were obtained out to 60 minutes following intravenous administration of radiopharmaceutical. Delayed images were not obtained. RADIOPHARMACEUTICALS:  5.03 mCi Tc-3132m  Choletec IV COMPARISON:  None FINDINGS: Normal tracer extraction from bloodstream indicating normal hepatocellular function. Normal excretion of tracer into biliary tree. Gallbladder first visualized at 12 min. At 1 hour at the conclusion of imaging, small bowel tracer was not visualized ; this prevents confirmation of patency of the CBD. IMPRESSION: Patent cystic duct. No evidence of acute cholecystitis. Electronically Signed   By: Ulyses SouthwardMark  Boles M.D.   On: 01/22/2016 16:24   Mr Abdomen Mrcp Wo Cm  01/22/2016  CLINICAL DATA:  Upper  abdominal pain. Epigastric and right upper quadrant. Elevated liver function tests. History of opiate abuse. EXAM: MRI ABDOMEN WITHOUT CONTRAST  (INCLUDING MRCP) TECHNIQUE: Multiplanar multisequence MR imaging of the abdomen was performed. Heavily T2-weighted images of the biliary and pancreatic ducts were obtained, and three-dimensional MRCP images were rendered by post processing. COMPARISON:  Nuclear medicine study of 01/22/2016. Ultrasound of 01/21/2016. CT of 01/21/2016. FINDINGS: Lower chest: Normal heart size without pericardial or pleural effusion. Hepatobiliary: Mild hepatomegaly, 18.6 cm craniocaudal. Severe heterogeneous hepatic steatosis on series 9. Mild lateral segment left liver lobe and caudate lobe enlargement image 15/series 3. No focal liver lesion. Gallbladder wall thickening again identified, including at 7 mm on image 31/ series 3. No gallstones identified. The intrahepatic ducts are borderline prominent, including a 4 mm left hepatic duct. The common duct is upper normal to mildly dilated, including at 7 mm in the porta hepatis on image 51/series 5. Tapers gradually including at 5 mm in the pancreatic head on image 39/series 5. No  choledocholithiasis identified. Pancreas: Normal, without mass or ductal dilatation. Spleen: Normal in size, without focal abnormality. Adrenals/Urinary Tract: Normal adrenal glands. Normal kidneys, without hydronephrosis. Stomach/Bowel: Normal stomach and abdominal bowel loops. Vascular/Lymphatic: Normal caliber of the aorta and branch vessels. Prominent porta hepatis nodes, including a 1.6 cm portacaval node on image 28/series 3. Other: New small volume perihepatic and right-sided abdominal ascites. Musculoskeletal: No acute osseous abnormality. IMPRESSION: 1. Patient was unable to finish the exam. No post-contrast imaging could be performed. 2. Heterogeneous hepatic steatosis. Prominent caudate and lateral segment left liver lobes with enlarged porta hepatis  nodes. Although this the adenopathy could be related to steatosis, steatohepatitis and/or mild cirrhosis are concerns. Correlate with risk factors and consider serology. 3. Borderline intrahepatic and borderline to mild proximal common duct dilatation, without stone or other cause identified. 4. Gallbladder wall thickening, which may be attributed to liver disease. No evidence of cholelithiasis or specific features of acute cholecystitis on prior ultrasound or nuclear medicine study. 5. Small volume new right upper abdominal ascites. Electronically Signed   By: Jeronimo GreavesKyle  Talbot M.D.   On: 01/22/2016 19:42   Mr 3d Recon At Scanner  01/22/2016  CLINICAL DATA:  Upper abdominal pain. Epigastric and right upper quadrant. Elevated liver function tests. History of opiate abuse. EXAM: MRI ABDOMEN WITHOUT CONTRAST  (INCLUDING MRCP) TECHNIQUE: Multiplanar multisequence MR imaging of the abdomen was performed. Heavily T2-weighted images of the biliary and pancreatic ducts were obtained, and three-dimensional MRCP images were rendered by post processing. COMPARISON:  Nuclear medicine study of 01/22/2016. Ultrasound of 01/21/2016. CT of 01/21/2016. FINDINGS: Lower chest: Normal heart size without pericardial or pleural effusion. Hepatobiliary: Mild hepatomegaly, 18.6 cm craniocaudal. Severe heterogeneous hepatic steatosis on series 9. Mild lateral segment left liver lobe and caudate lobe enlargement image 15/series 3. No focal liver lesion. Gallbladder wall thickening again identified, including at 7 mm on image 31/ series 3. No gallstones identified. The intrahepatic ducts are borderline prominent, including a 4 mm left hepatic duct. The common duct is upper normal to mildly dilated, including at 7 mm in the porta hepatis on image 51/series 5. Tapers gradually including at 5 mm in the pancreatic head on image 39/series 5. No choledocholithiasis identified. Pancreas: Normal, without mass or ductal dilatation. Spleen: Normal in  size, without focal abnormality. Adrenals/Urinary Tract: Normal adrenal glands. Normal kidneys, without hydronephrosis. Stomach/Bowel: Normal stomach and abdominal bowel loops. Vascular/Lymphatic: Normal caliber of the aorta and branch vessels. Prominent porta hepatis nodes, including a 1.6 cm portacaval node on image 28/series 3. Other: New small volume perihepatic and right-sided abdominal ascites. Musculoskeletal: No acute osseous abnormality. IMPRESSION: 1. Patient was unable to finish the exam. No post-contrast imaging could be performed. 2. Heterogeneous hepatic steatosis. Prominent caudate and lateral segment left liver lobes with enlarged porta hepatis nodes. Although this the adenopathy could be related to steatosis, steatohepatitis and/or mild cirrhosis are concerns. Correlate with risk factors and consider serology. 3. Borderline intrahepatic and borderline to mild proximal common duct dilatation, without stone or other cause identified. 4. Gallbladder wall thickening, which may be attributed to liver disease. No evidence of cholelithiasis or specific features of acute cholecystitis on prior ultrasound or nuclear medicine study. 5. Small volume new right upper abdominal ascites. Electronically Signed   By: Jeronimo GreavesKyle  Talbot M.D.   On: 01/22/2016 19:42        Scheduled Meds: . famotidine  20 mg Oral BID  . heparin subcutaneous  5,000 Units Subcutaneous Q8H  . methadone  40 mg  Oral Daily   Continuous Infusions: . sodium chloride 75 mL/hr at 01/24/16 0403     LOS: 2 days    Time spent: 35 minutes.     Alba Cory, MD [facility] Pager [phone]  If 7PM-7AM, please contact night-coverage [email or web address] Password [facility] 01/24/2016, 9:52 AM

## 2016-01-24 NOTE — Progress Notes (Signed)
IV removed. Labs stable for d/c. Follow up outpatient. Infectious Disease office notified and will be contacting the patient to make appointment. Patient is aware of this. Pt also informed to follow up w/ PCP. AVS reviewed at bedside. Printed prescriptions provided to pt. Justin Mendaudle, Kiauna Zywicki H, RN

## 2016-01-24 NOTE — Progress Notes (Signed)
The patient is receiving Pepcid by the intravenous route.  Based on criteria approved by the Pharmacy and Therapeutics Committee and the Medical Executive Committee, the medication is being converted to the equivalent oral dose form.  These criteria include: -No Active GI bleeding -Able to tolerate diet of full liquids (or better) or tube feeding OR able to tolerate other medications by the oral or enteral route  If you have any questions about this conversion, please contact the Pharmacy Department (ext [phone]).  Thank you.  Elson ClanLilliston, Zylpha Poynor Michelle, [facility] Hospital[facility]PH 01/24/2016 7:54 AM

## 2016-01-24 NOTE — Progress Notes (Signed)
[facility] Progress Note  Subjective: The patient is feeling much better today. Most of his abdominal pain has resolved. I told him that he has hepatitis C. I asked him when his last IV injection with heroin was and he told me it was about a year. This would not be consistent with acute hepatitis C. This would be too long of an incubation period. However I then asked him about his tattoos and when he had a tattoo last. He had a tattoo about a month ago. I asked him where this was done and he told me it was done at his house by his brother. It is possible that this is the source of his acute hepatitis C infection and would be within the incubation period time.  Objective: Vital signs in last 24 hours: Temp:  [98.7 F (37.1 C)-98.9 F (37.2 C)] 98.9 F (37.2 C) (06/23 0503) Pulse Rate:  [71-75] 75 (06/23 0503) Resp:  [16-18] 16 (06/23 0503) BP: (110-118)/(72-81) 118/81 mmHg (06/23 0503) SpO2:  [96 %-98 %] 96 % (06/23 0503) Weight change:    PE:  He is in no distress  Abdomen is soft and nontender  Lab Results: Results for orders placed or performed during the hospital encounter of 01/21/16 (from the past 24 hour(s))  Comprehensive metabolic panel     Status: Abnormal   Collection Time: 01/24/16  5:08 AM  Result Value Ref Range   Sodium 139 135 - 145 mmol/L   Potassium 3.6 3.5 - 5.1 mmol/L   Chloride 108 101 - 111 mmol/L   CO2 26 22 - 32 mmol/L   Glucose, Bld 127 (H) 65 - 99 mg/dL   BUN 6 6 - 20 mg/dL   Creatinine, Ser 1.610.75 0.61 - 1.24 mg/dL   Calcium 8.4 (L) 8.9 - 10.3 mg/dL   Total Protein 5.9 (L) 6.5 - 8.1 g/dL   Albumin 3.2 (L) 3.5 - 5.0 g/dL   AST 096663 (H) 15 - 41 U/L   ALT 1222 (H) 17 - 63 U/L   Alkaline Phosphatase 205 (H) 38 - 126 U/L   Total Bilirubin 2.1 (H) 0.3 - 1.2 mg/dL   GFR calc non Af Amer >60 >60 mL/min   GFR calc Af Amer >60 >60 mL/min   Anion gap 5 5 - 15  CBC     Status: None   Collection Time: 01/24/16  5:08 AM  Result Value Ref Range   WBC 4.4 4.0 - 10.5 K/uL   RBC 4.71 4.22 - 5.81 MIL/uL   Hemoglobin 13.5 13.0 - 17.0 g/dL   HCT 04.541.0 40.939.0 - 81.152.0 %   MCV 87.0 78.0 - 100.0 fL   MCH 28.7 26.0 - 34.0 pg   MCHC 32.9 30.0 - 36.0 g/dL   RDW 91.413.2 78.211.5 - 95.615.5 %   Platelets 206 150 - 400 K/uL    Studies/Results: No results found.    Assessment: Acute hepatitis C    Plan:   Clinically he seems to be doing quite well today. His LFTs are still elevated. It appears however that they have stabilized. Since he is eating, and no longer having abdominal pain, I think he can probably be discharged. I would recommend making [facility] to follow and discuss further treatment of hepatitis C. We will sign off. Call us if needed.    Gwenevere AbbotSAM F Leshea Jaggers 01/24/2016, 9:40 AM  Pager: [phone] If no answer or after 5 PM call [phone]

## 2016-01-25 LAB — HIV ANTIBODY (ROUTINE TESTING W REFLEX): HIV SCREEN 4TH GENERATION: NONREACTIVE

## 2016-01-27 LAB — HEPATITIS C VRS RNA DETECT BY PCR-QUAL: HEPATITIS C VRS RNA BY PCR-QUAL: POSITIVE — AB

## 2017-01-11 ENCOUNTER — Emergency Department (HOSPITAL_COMMUNITY): Payer: Medicaid Other

## 2017-01-11 ENCOUNTER — Encounter (HOSPITAL_COMMUNITY): Payer: Self-pay

## 2017-01-11 ENCOUNTER — Emergency Department (HOSPITAL_COMMUNITY)
Admission: EM | Admit: 2017-01-11 | Discharge: 2017-01-12 | Payer: Medicaid Other | Attending: Emergency Medicine | Admitting: Emergency Medicine

## 2017-01-11 DIAGNOSIS — F17210 Nicotine dependence, cigarettes, uncomplicated: Secondary | ICD-10-CM | POA: Insufficient documentation

## 2017-01-11 DIAGNOSIS — R569 Unspecified convulsions: Secondary | ICD-10-CM | POA: Diagnosis not present

## 2017-01-11 LAB — CBC WITH DIFFERENTIAL/PLATELET
BASOS ABS: 0 10*3/uL (ref 0.0–0.1)
Basophils Relative: 0 %
EOS ABS: 0 10*3/uL (ref 0.0–0.7)
EOS PCT: 0 %
HCT: 44 % (ref 39.0–52.0)
Hemoglobin: 15.3 g/dL (ref 13.0–17.0)
Lymphocytes Relative: 29 %
Lymphs Abs: 2.2 10*3/uL (ref 0.7–4.0)
MCH: 29.5 pg (ref 26.0–34.0)
MCHC: 34.8 g/dL (ref 30.0–36.0)
MCV: 84.9 fL (ref 78.0–100.0)
MONO ABS: 0.4 10*3/uL (ref 0.1–1.0)
MONOS PCT: 5 %
Neutro Abs: 4.8 10*3/uL (ref 1.7–7.7)
Neutrophils Relative %: 66 %
PLATELETS: 217 10*3/uL (ref 150–400)
RBC: 5.18 MIL/uL (ref 4.22–5.81)
RDW: 12.5 % (ref 11.5–15.5)
WBC: 7.3 10*3/uL (ref 4.0–10.5)

## 2017-01-11 LAB — BASIC METABOLIC PANEL
ANION GAP: 6 (ref 5–15)
BUN: 10 mg/dL (ref 6–20)
CALCIUM: 9.1 mg/dL (ref 8.9–10.3)
CO2: 28 mmol/L (ref 22–32)
CREATININE: 0.72 mg/dL (ref 0.61–1.24)
Chloride: 104 mmol/L (ref 101–111)
GFR calc Af Amer: >60 mL/min (ref >60)
GLUCOSE: 91 mg/dL (ref 65–99)
Potassium: 3.7 mmol/L (ref 3.5–5.1)
Sodium: 138 mmol/L (ref 135–145)

## 2017-01-11 LAB — SALICYLATE LEVEL: Salicylate Lvl: <7 mg/dL (ref 2.8–30.0)

## 2017-01-11 LAB — ACETAMINOPHEN LEVEL

## 2017-01-11 LAB — RAPID URINE DRUG SCREEN, HOSP PERFORMED
AMPHETAMINES: NOT DETECTED
BARBITURATES: NOT DETECTED
Benzodiazepines: NOT DETECTED
COCAINE: NOT DETECTED
Opiates: NOT DETECTED
TETRAHYDROCANNABINOL: NOT DETECTED

## 2017-01-11 LAB — ETHANOL

## 2017-01-11 LAB — CBG MONITORING, ED: Glucose-Capillary: 84 mg/dL (ref 65–99)

## 2017-01-11 MED ORDER — PANTOPRAZOLE SODIUM 40 MG IV SOLR
40.0000 mg | Freq: Once | INTRAVENOUS | Status: AC
  Administered 2017-01-11: 40 mg via INTRAVENOUS
  Filled 2017-01-11: qty 40

## 2017-01-11 MED ORDER — SODIUM CHLORIDE 0.9 % IV BOLUS (SEPSIS)
1000.0000 mL | Freq: Once | INTRAVENOUS | Status: AC
  Administered 2017-01-11: 1000 mL via INTRAVENOUS

## 2017-01-11 MED ORDER — KETOROLAC TROMETHAMINE 30 MG/ML IJ SOLN
15.0000 mg | Freq: Once | INTRAMUSCULAR | Status: AC
  Administered 2017-01-12: 15 mg via INTRAVENOUS
  Filled 2017-01-11: qty 1

## 2017-01-11 NOTE — ED Triage Notes (Signed)
[facility] Per [facility] staff, pt had 6 episodes of SZ like activity consecutively. No urination, defecation, or oral trauma is noted. Per EMS pt shook head back in forth a few  But  decline to report was apparent SZ activity enroute.Per EMS  [place] and [place]. Per EMS pt has Opioid withdrawal    VS BP  100/60 HR 84 RR 16 CBG 98

## 2017-01-11 NOTE — ED Notes (Signed)
Pt is alert and verbally responsive. Pt is alert to self, but disoriented to place reporting [place], and DOB,  reporting 05/15/1971.
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
## 2017-01-12 LAB — AMMONIA: Ammonia: 39 umol/L — ABNORMAL HIGH (ref 9–35)

## 2017-01-12 MED ORDER — AMOXICILLIN 500 MG PO CAPS: 500.0000 mg | ORAL_CAPSULE | Freq: Three times a day (TID) | ORAL | 0 refills | Status: DC

## 2017-01-12 NOTE — Discharge Instructions (Signed)
Patient's lab work and imaging has been normal. Unsure of the cause of his seizure-like activity today. Doubt true seizure disorder. May follow-up with neurology if symptoms persist.May have right ear infection.Take antibiotics as prescribed. Follow up with primary care doctor if symptoms persits.

## 2017-02-24 ENCOUNTER — Emergency Department (HOSPITAL_COMMUNITY): Payer: Medicaid Other

## 2017-02-24 ENCOUNTER — Emergency Department (HOSPITAL_COMMUNITY)
Admission: EM | Admit: 2017-02-24 | Discharge: 2017-02-24 | Disposition: A | Discharge status: Home / Self Care | Payer: Medicaid Other | Attending: Emergency Medicine | Admitting: Emergency Medicine

## 2017-02-24 ENCOUNTER — Encounter (HOSPITAL_COMMUNITY): Payer: Self-pay | Admitting: *Deleted

## 2017-02-24 DIAGNOSIS — F1910 Other psychoactive substance abuse, uncomplicated: Secondary | ICD-10-CM | POA: Insufficient documentation

## 2017-02-24 DIAGNOSIS — Y9289 Other specified places as the place of occurrence of the external cause: Secondary | ICD-10-CM | POA: Insufficient documentation

## 2017-02-24 DIAGNOSIS — Y999 Unspecified external cause status: Secondary | ICD-10-CM | POA: Insufficient documentation

## 2017-02-24 DIAGNOSIS — R55 Syncope and collapse: Secondary | ICD-10-CM | POA: Insufficient documentation

## 2017-02-24 DIAGNOSIS — S270XXA Traumatic pneumothorax, initial encounter: Secondary | ICD-10-CM | POA: Insufficient documentation

## 2017-02-24 DIAGNOSIS — Y939 Activity, unspecified: Secondary | ICD-10-CM | POA: Diagnosis not present

## 2017-02-24 DIAGNOSIS — M542 Cervicalgia: Secondary | ICD-10-CM | POA: Insufficient documentation

## 2017-02-24 DIAGNOSIS — H5703 Miosis: Secondary | ICD-10-CM | POA: Insufficient documentation

## 2017-02-24 DIAGNOSIS — Z87891 Personal history of nicotine dependence: Secondary | ICD-10-CM | POA: Insufficient documentation

## 2017-02-24 DIAGNOSIS — R400 Somnolence: Secondary | ICD-10-CM | POA: Insufficient documentation

## 2017-02-24 DIAGNOSIS — S299XXA Unspecified injury of thorax, initial encounter: Secondary | ICD-10-CM | POA: Diagnosis present

## 2017-02-24 LAB — COMPREHENSIVE METABOLIC PANEL
ALT: 35 U/L (ref 17–63)
AST: 49 U/L — AB (ref 15–41)
Albumin: 3.5 g/dL (ref 3.5–5.0)
Alkaline Phosphatase: 86 U/L (ref 38–126)
Anion gap: 8 (ref 5–15)
BUN: 8 mg/dL (ref 6–20)
CHLORIDE: 107 mmol/L (ref 101–111)
CO2: 25 mmol/L (ref 22–32)
Calcium: 9.1 mg/dL (ref 8.9–10.3)
Creatinine, Ser: 0.78 mg/dL (ref 0.61–1.24)
GFR calc Af Amer: >60 mL/min (ref >60)
GFR calc non Af Amer: >60 mL/min (ref >60)
Glucose, Bld: 110 mg/dL — ABNORMAL HIGH (ref 65–99)
Potassium: 3.6 mmol/L (ref 3.5–5.1)
SODIUM: 140 mmol/L (ref 135–145)
Total Bilirubin: 0.6 mg/dL (ref 0.3–1.2)
Total Protein: 6.8 g/dL (ref 6.5–8.1)

## 2017-02-24 LAB — CBC WITH DIFFERENTIAL/PLATELET
BASOS ABS: 0 10*3/uL (ref 0.0–0.1)
Basophils Relative: 0 %
EOS ABS: 0 10*3/uL (ref 0.0–0.7)
EOS PCT: 1 %
HCT: 40.1 % (ref 39.0–52.0)
Hemoglobin: 13.4 g/dL (ref 13.0–17.0)
LYMPHS PCT: 13 %
Lymphs Abs: 0.9 10*3/uL (ref 0.7–4.0)
MCH: 28.9 pg (ref 26.0–34.0)
MCHC: 33.4 g/dL (ref 30.0–36.0)
MCV: 86.6 fL (ref 78.0–100.0)
Monocytes Absolute: 0.4 10*3/uL (ref 0.1–1.0)
Monocytes Relative: 6 %
Neutro Abs: 5.6 10*3/uL (ref 1.7–7.7)
Neutrophils Relative %: 80 %
PLATELETS: 226 10*3/uL (ref 150–400)
RBC: 4.63 MIL/uL (ref 4.22–5.81)
RDW: 12.8 % (ref 11.5–15.5)
WBC: 7 10*3/uL (ref 4.0–10.5)

## 2017-02-24 LAB — LIPASE, BLOOD: Lipase: 47 U/L (ref 11–51)

## 2017-02-24 LAB — URINALYSIS, ROUTINE W REFLEX MICROSCOPIC
Bilirubin Urine: NEGATIVE
Glucose, UA: NEGATIVE mg/dL
KETONES UR: NEGATIVE mg/dL
Leukocytes, UA: NEGATIVE
Nitrite: NEGATIVE
PH: 9 — AB (ref 5.0–8.0)
Protein, ur: 30 mg/dL — AB
SPECIFIC GRAVITY, URINE: 1.011 (ref 1.005–1.030)
Squamous Epithelial / LPF: NONE SEEN

## 2017-02-24 LAB — RAPID URINE DRUG SCREEN, HOSP PERFORMED
AMPHETAMINES: NOT DETECTED
BARBITURATES: NOT DETECTED
BENZODIAZEPINES: NOT DETECTED
Cocaine: NOT DETECTED
Opiates: NOT DETECTED
Tetrahydrocannabinol: POSITIVE — AB

## 2017-02-24 LAB — ETHANOL: Alcohol, Ethyl (B): <5 mg/dL (ref <5)

## 2017-02-24 MED ORDER — NALOXONE HCL 0.4 MG/ML IJ SOLN
INTRAMUSCULAR | Status: AC
  Filled 2017-02-24: qty 1

## 2017-02-24 MED ORDER — IOPAMIDOL (ISOVUE-300) INJECTION 61%
INTRAVENOUS | Status: AC
  Administered 2017-02-24: 100 mL
  Filled 2017-02-24: qty 100

## 2017-02-24 MED ORDER — NALOXONE HCL 0.4 MG/ML IJ SOLN: 0.4000 mg | Freq: Once | INTRAMUSCULAR | Status: DC

## 2017-02-24 NOTE — ED Notes (Signed)
Pt remains ao x 4.  Easy to arouse.
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
## 2017-02-24 NOTE — Discharge Instructions (Signed)
YOUR X RAYS AND IMAGED SHOWED A SMALL PNEUMOTHORAX ON BOTH SIDES, WHICH IS AIR OUTSIDE OF YOUR LUNG. THIS WILL LIKELY RESOLVE ON ITS OWN OVER THE NEXT SEVERAL DAYS.  DO NOT SMOKE, SWIM, OR FLY FOR AT LEAST ONE WEEK  RETURN TO THE ER IMMEDIATELY IF YOU DEVELOP: - WORSENING CHEST PAIN - WORSENING SHORTNESS OF BREATH - WORSENING COUGH - COUGHING UP BLOOD - LIGHTHEADEDNESS OR PASSING OUT
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
## 2017-02-24 NOTE — ED Notes (Signed)
Pt refused CT. MD notified of same.

## 2017-02-24 NOTE — ED Notes (Signed)
Patient transported to X-ray 

## 2017-02-24 NOTE — ED Notes (Signed)
Pt to CT again.  Arouses with sternal rub only, but remains alert.

## 2017-12-17 ENCOUNTER — Emergency Department (HOSPITAL_COMMUNITY): Payer: Medicaid Other

## 2017-12-17 ENCOUNTER — Other Ambulatory Visit: Payer: Self-pay

## 2017-12-17 ENCOUNTER — Emergency Department (HOSPITAL_COMMUNITY)
Admission: EM | Admit: 2017-12-17 | Discharge: 2017-12-17 | Disposition: A | Discharge status: Home / Self Care | Payer: Medicaid Other | Attending: Emergency Medicine | Admitting: Emergency Medicine

## 2017-12-17 ENCOUNTER — Encounter (HOSPITAL_COMMUNITY): Payer: Self-pay | Admitting: Emergency Medicine

## 2017-12-17 DIAGNOSIS — Z87891 Personal history of nicotine dependence: Secondary | ICD-10-CM | POA: Insufficient documentation

## 2017-12-17 DIAGNOSIS — M545 Low back pain, unspecified: Secondary | ICD-10-CM

## 2017-12-17 DIAGNOSIS — Z79899 Other long term (current) drug therapy: Secondary | ICD-10-CM | POA: Diagnosis not present

## 2017-12-17 MED ORDER — METHOCARBAMOL 500 MG PO TABS: 500.0000 mg | ORAL_TABLET | Freq: Two times a day (BID) | ORAL | 0 refills | Status: DC

## 2017-12-17 MED ORDER — KETOROLAC TROMETHAMINE 30 MG/ML IJ SOLN
30.0000 mg | Freq: Once | INTRAMUSCULAR | Status: AC
  Administered 2017-12-17: 30 mg via INTRAVENOUS
  Filled 2017-12-17: qty 1

## 2017-12-17 MED ORDER — NAPROXEN 500 MG PO TABS: 500.0000 mg | ORAL_TABLET | Freq: Two times a day (BID) | ORAL | 0 refills | Status: DC

## 2017-12-17 MED ORDER — METHOCARBAMOL 500 MG PO TABS
1000.0000 mg | ORAL_TABLET | Freq: Once | ORAL | Status: AC
Start: 2017-12-17 — End: 2017-12-17
  Administered 2017-12-17: 1000 mg via ORAL
  Filled 2017-12-17: qty 2

## 2017-12-17 NOTE — Discharge Instructions (Signed)
Please take Naprosyn twice daily and Robaxin to help with back pain.  This medication can cause some drowsiness do not take before driving and do not combine with alcohol.  You may also combine Tylenol with these medications.  Please use ice and heat, you may also try Biofreeze or salonpas patches.  Avoid heavy lifting or forward bending but try to do some light stretching rather than staying completely still.  Please follow-up with your primary care doctor for continued management.

## 2017-12-17 NOTE — ED Notes (Signed)
Pt is alert and oriented x 4 pt reports 10/10 pain  Aches and sharp pain. Pt denies that pain radiates. Pt reports that pain  started Sunday and has gotten progressively worse.

## 2017-12-17 NOTE — ED Triage Notes (Addendum)
Per pt, states lower back pain that started Sunday-no recent injury or trauma-states no relief with OTC meds-no disuria
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
## 2018-03-07 ENCOUNTER — Emergency Department (HOSPITAL_COMMUNITY)
Admission: EM | Admit: 2018-03-07 | Discharge: 2018-03-07 | Disposition: A | Discharge status: Home / Self Care | Payer: Medicaid Other | Attending: Emergency Medicine | Admitting: Emergency Medicine

## 2018-03-07 ENCOUNTER — Emergency Department (HOSPITAL_COMMUNITY): Payer: Medicaid Other

## 2018-03-07 ENCOUNTER — Encounter (HOSPITAL_COMMUNITY): Payer: Self-pay | Admitting: Emergency Medicine

## 2018-03-07 ENCOUNTER — Other Ambulatory Visit: Payer: Self-pay

## 2018-03-07 DIAGNOSIS — Y9289 Other specified places as the place of occurrence of the external cause: Secondary | ICD-10-CM | POA: Diagnosis not present

## 2018-03-07 DIAGNOSIS — Z79899 Other long term (current) drug therapy: Secondary | ICD-10-CM | POA: Diagnosis not present

## 2018-03-07 DIAGNOSIS — Y998 Other external cause status: Secondary | ICD-10-CM | POA: Diagnosis not present

## 2018-03-07 DIAGNOSIS — S51851A Open bite of right forearm, initial encounter: Secondary | ICD-10-CM | POA: Diagnosis not present

## 2018-03-07 DIAGNOSIS — S59911A Unspecified injury of right forearm, initial encounter: Secondary | ICD-10-CM | POA: Diagnosis present

## 2018-03-07 DIAGNOSIS — Y9389 Activity, other specified: Secondary | ICD-10-CM | POA: Diagnosis not present

## 2018-03-07 DIAGNOSIS — Z87891 Personal history of nicotine dependence: Secondary | ICD-10-CM | POA: Diagnosis not present

## 2018-03-07 DIAGNOSIS — Z23 Encounter for immunization: Secondary | ICD-10-CM | POA: Diagnosis not present

## 2018-03-07 DIAGNOSIS — W540XXA Bitten by dog, initial encounter: Secondary | ICD-10-CM | POA: Insufficient documentation

## 2018-03-07 DIAGNOSIS — F1210 Cannabis abuse, uncomplicated: Secondary | ICD-10-CM | POA: Insufficient documentation

## 2018-03-07 MED ORDER — TETANUS-DIPHTH-ACELL PERTUSSIS 5-2.5-18.5 LF-MCG/0.5 IM SUSP
0.5000 mL | Freq: Once | INTRAMUSCULAR | Status: AC
  Administered 2018-03-07: 0.5 mL via INTRAMUSCULAR
  Filled 2018-03-07: qty 0.5

## 2018-03-07 MED ORDER — DICLOFENAC SODIUM ER 100 MG PO TB24: 100.0000 mg | ORAL_TABLET | Freq: Every day | ORAL | 0 refills | Status: DC

## 2018-03-07 MED ORDER — AMOXICILLIN-POT CLAVULANATE 875-125 MG PO TABS: 1.0000 | ORAL_TABLET | Freq: Two times a day (BID) | ORAL | 0 refills | Status: DC

## 2018-03-07 MED ORDER — AMOXICILLIN-POT CLAVULANATE 875-125 MG PO TABS
1.0000 | ORAL_TABLET | Freq: Once | ORAL | Status: AC
  Administered 2018-03-07: 1 via ORAL
  Filled 2018-03-07: qty 1

## 2018-03-07 MED ORDER — KETOROLAC TROMETHAMINE 60 MG/2ML IM SOLN
30.0000 mg | Freq: Once | INTRAMUSCULAR | Status: AC
  Administered 2018-03-07: 30 mg via INTRAMUSCULAR
  Filled 2018-03-07: qty 2

## 2018-03-07 NOTE — ED Triage Notes (Signed)
Patient was bitten by a dog today around 6 pm on the right lower arm. Patient has several puncture marks on his arm. Arm is not bleeding currently. Patient does not know if the dog has their shots or not.
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
## 2022-02-14 ENCOUNTER — Inpatient Hospital Stay (HOSPITAL_COMMUNITY)
Admission: EM | Admit: 2022-02-14 | Discharge: 2022-02-21 | DRG: 956 | Discharge status: Against Medical Advice | Payer: Medicaid Other | Attending: Surgery | Admitting: Surgery

## 2022-02-14 ENCOUNTER — Inpatient Hospital Stay (HOSPITAL_COMMUNITY): Payer: Medicaid Other

## 2022-02-14 ENCOUNTER — Emergency Department (HOSPITAL_COMMUNITY): Payer: Medicaid Other

## 2022-02-14 DIAGNOSIS — S72001A Fracture of unspecified part of neck of right femur, initial encounter for closed fracture: Secondary | ICD-10-CM | POA: Diagnosis present

## 2022-02-14 DIAGNOSIS — R319 Hematuria, unspecified: Secondary | ICD-10-CM | POA: Diagnosis present

## 2022-02-14 DIAGNOSIS — S32421A Displaced fracture of posterior wall of right acetabulum, initial encounter for closed fracture: Secondary | ICD-10-CM | POA: Diagnosis present

## 2022-02-14 DIAGNOSIS — S20319A Abrasion of unspecified front wall of thorax, initial encounter: Secondary | ICD-10-CM | POA: Diagnosis present

## 2022-02-14 DIAGNOSIS — S73014A Posterior dislocation of right hip, initial encounter: Secondary | ICD-10-CM | POA: Diagnosis present

## 2022-02-14 DIAGNOSIS — S37032A Laceration of left kidney, unspecified degree, initial encounter: Secondary | ICD-10-CM | POA: Diagnosis present

## 2022-02-14 DIAGNOSIS — Z9911 Dependence on respirator [ventilator] status: Secondary | ICD-10-CM

## 2022-02-14 DIAGNOSIS — J9601 Acute respiratory failure with hypoxia: Secondary | ICD-10-CM | POA: Diagnosis not present

## 2022-02-14 DIAGNOSIS — F1120 Opioid dependence, uncomplicated: Secondary | ICD-10-CM | POA: Diagnosis present

## 2022-02-14 DIAGNOSIS — D62 Acute posthemorrhagic anemia: Secondary | ICD-10-CM | POA: Diagnosis not present

## 2022-02-14 DIAGNOSIS — F1910 Other psychoactive substance abuse, uncomplicated: Secondary | ICD-10-CM | POA: Diagnosis present

## 2022-02-14 DIAGNOSIS — Z781 Physical restraint status: Secondary | ICD-10-CM

## 2022-02-14 DIAGNOSIS — Z87891 Personal history of nicotine dependence: Secondary | ICD-10-CM

## 2022-02-14 DIAGNOSIS — E876 Hypokalemia: Secondary | ICD-10-CM | POA: Diagnosis present

## 2022-02-14 DIAGNOSIS — S066XAA Traumatic subarachnoid hemorrhage with loss of consciousness status unknown, initial encounter: Secondary | ICD-10-CM | POA: Diagnosis present

## 2022-02-14 DIAGNOSIS — Z23 Encounter for immunization: Secondary | ICD-10-CM | POA: Diagnosis not present

## 2022-02-14 DIAGNOSIS — Y92410 Unspecified street and highway as the place of occurrence of the external cause: Secondary | ICD-10-CM

## 2022-02-14 DIAGNOSIS — R569 Unspecified convulsions: Secondary | ICD-10-CM | POA: Diagnosis present

## 2022-02-14 DIAGNOSIS — G709 Myoneural disorder, unspecified: Secondary | ICD-10-CM | POA: Diagnosis not present

## 2022-02-14 DIAGNOSIS — S32461A Displaced associated transverse-posterior fracture of right acetabulum, initial encounter for closed fracture: Secondary | ICD-10-CM | POA: Diagnosis present

## 2022-02-14 LAB — I-STAT ARTERIAL BLOOD GAS, ED
Acid-Base Excess: 3 mmol/L — ABNORMAL HIGH (ref 0.0–2.0)
Bicarbonate: 28.6 mmol/L — ABNORMAL HIGH (ref 20.0–28.0)
Calcium, Ion: 1.17 mmol/L (ref 1.15–1.40)
HCT: 38 % — ABNORMAL LOW (ref 39.0–52.0)
Hemoglobin: 12.9 g/dL — ABNORMAL LOW (ref 13.0–17.0)
O2 Saturation: 100 %
Patient temperature: 37
Potassium: 3.5 mmol/L (ref 3.5–5.1)
Sodium: 139 mmol/L (ref 135–145)
TCO2: 30 mmol/L (ref 22–32)
pCO2 arterial: 44.7 mmHg (ref 32–48)
pH, Arterial: 7.414 (ref 7.35–7.45)
pO2, Arterial: 612 mmHg — ABNORMAL HIGH (ref 83–108)

## 2022-02-14 LAB — COMPREHENSIVE METABOLIC PANEL
ALT: 50 U/L — ABNORMAL HIGH (ref 0–44)
AST: 62 U/L — ABNORMAL HIGH (ref 15–41)
Albumin: 3.4 g/dL — ABNORMAL LOW (ref 3.5–5.0)
Alkaline Phosphatase: 97 U/L (ref 38–126)
Anion gap: 12 (ref 5–15)
BUN: 10 mg/dL (ref 6–20)
CO2: 22 mmol/L (ref 22–32)
Calcium: 8.4 mg/dL — ABNORMAL LOW (ref 8.9–10.3)
Chloride: 103 mmol/L (ref 98–111)
Creatinine, Ser: 0.9 mg/dL (ref 0.61–1.24)
GFR, Estimated: >60 mL/min (ref >60)
Glucose, Bld: 140 mg/dL — ABNORMAL HIGH (ref 70–99)
Potassium: 3.7 mmol/L (ref 3.5–5.1)
Sodium: 137 mmol/L (ref 135–145)
Total Bilirubin: 0.7 mg/dL (ref 0.3–1.2)
Total Protein: 6.4 g/dL — ABNORMAL LOW (ref 6.5–8.1)

## 2022-02-14 LAB — RAPID URINE DRUG SCREEN, HOSP PERFORMED
Amphetamines: POSITIVE — AB
Barbiturates: NOT DETECTED
Benzodiazepines: POSITIVE — AB
Cocaine: NOT DETECTED
Opiates: NOT DETECTED
Tetrahydrocannabinol: NOT DETECTED

## 2022-02-14 LAB — I-STAT CHEM 8, ED
BUN: 13 mg/dL (ref 6–20)
Calcium, Ion: 1.03 mmol/L — ABNORMAL LOW (ref 1.15–1.40)
Chloride: 105 mmol/L (ref 98–111)
Creatinine, Ser: 0.9 mg/dL (ref 0.61–1.24)
Glucose, Bld: 143 mg/dL — ABNORMAL HIGH (ref 70–99)
HCT: 44 % (ref 39.0–52.0)
Hemoglobin: 15 g/dL (ref 13.0–17.0)
Potassium: 5.3 mmol/L — ABNORMAL HIGH (ref 3.5–5.1)
Sodium: 139 mmol/L (ref 135–145)
TCO2: 25 mmol/L (ref 22–32)

## 2022-02-14 LAB — ETHANOL: Alcohol, Ethyl (B): <10 mg/dL (ref <10)

## 2022-02-14 LAB — AMMONIA: Ammonia: 54 umol/L — ABNORMAL HIGH (ref 9–35)

## 2022-02-14 LAB — CBC
HCT: 43 % (ref 39.0–52.0)
Hemoglobin: 14.5 g/dL (ref 13.0–17.0)
MCH: 29.4 pg (ref 26.0–34.0)
MCHC: 33.7 g/dL (ref 30.0–36.0)
MCV: 87.2 fL (ref 80.0–100.0)
Platelets: 269 10*3/uL (ref 150–400)
RBC: 4.93 MIL/uL (ref 4.22–5.81)
RDW: 12.4 % (ref 11.5–15.5)
WBC: 11.2 10*3/uL — ABNORMAL HIGH (ref 4.0–10.5)
nRBC: 0 % (ref 0.0–0.2)

## 2022-02-14 LAB — MRSA NEXT GEN BY PCR, NASAL: MRSA by PCR Next Gen: NOT DETECTED

## 2022-02-14 MED ORDER — ORAL CARE MOUTH RINSE
15.0000 mL | OROMUCOSAL | Status: DC
  Administered 2022-02-14 (×2): 15 mL via OROMUCOSAL

## 2022-02-14 MED ORDER — SODIUM CHLORIDE 0.9 % IV SOLN: INTRAVENOUS | Status: DC

## 2022-02-14 MED ORDER — FENTANYL 2500MCG IN NS 250ML (10MCG/ML) PREMIX INFUSION
50.0000 ug/h | INTRAVENOUS | Status: DC
  Administered 2022-02-14: 50 ug/h via INTRAVENOUS
  Administered 2022-02-15: 150 ug/h via INTRAVENOUS
  Filled 2022-02-14 (×3): qty 250

## 2022-02-14 MED ORDER — FENTANYL CITRATE PF 50 MCG/ML IJ SOSY
50.0000 ug | PREFILLED_SYRINGE | INTRAMUSCULAR | Status: DC | PRN
  Administered 2022-02-15: 50 ug via INTRAVENOUS

## 2022-02-14 MED ORDER — TETANUS-DIPHTH-ACELL PERTUSSIS 5-2.5-18.5 LF-MCG/0.5 IM SUSY
0.5000 mL | PREFILLED_SYRINGE | Freq: Once | INTRAMUSCULAR | Status: AC
  Administered 2022-02-14: 0.5 mL via INTRAMUSCULAR
  Filled 2022-02-14: qty 0.5

## 2022-02-14 MED ORDER — ORAL CARE MOUTH RINSE
15.0000 mL | OROMUCOSAL | Status: DC
Start: 2022-02-14 — End: 2022-02-16
  Administered 2022-02-14 – 2022-02-16 (×25): 15 mL via OROMUCOSAL

## 2022-02-14 MED ORDER — POLYETHYLENE GLYCOL 3350 17 G PO PACK
17.0000 g | PACK | Freq: Every day | ORAL | Status: DC
  Administered 2022-02-14 – 2022-02-15 (×2): 17 g
  Filled 2022-02-14 (×2): qty 1

## 2022-02-14 MED ORDER — LEVETIRACETAM IN NACL 500 MG/100ML IV SOLN
500.0000 mg | Freq: Two times a day (BID) | INTRAVENOUS | Status: DC
  Administered 2022-02-14 – 2022-02-17 (×7): 500 mg via INTRAVENOUS
  Filled 2022-02-14 (×7): qty 100

## 2022-02-14 MED ORDER — ROCURONIUM BROMIDE 10 MG/ML (PF) SYRINGE
PREFILLED_SYRINGE | INTRAVENOUS | Status: AC
  Filled 2022-02-14: qty 10

## 2022-02-14 MED ORDER — CHLORHEXIDINE GLUCONATE CLOTH 2 % EX PADS
6.0000 | MEDICATED_PAD | Freq: Every day | CUTANEOUS | Status: DC
  Administered 2022-02-15 – 2022-02-20 (×5): 6 via TOPICAL

## 2022-02-14 MED ORDER — ORAL CARE MOUTH RINSE: 15.0000 mL | OROMUCOSAL | Status: DC | PRN

## 2022-02-14 MED ORDER — ONDANSETRON HCL 4 MG/2ML IJ SOLN: 4.0000 mg | Freq: Four times a day (QID) | INTRAMUSCULAR | Status: DC | PRN

## 2022-02-14 MED ORDER — DOCUSATE SODIUM 50 MG/5ML PO LIQD
100.0000 mg | Freq: Two times a day (BID) | ORAL | Status: DC
  Administered 2022-02-14 – 2022-02-15 (×4): 100 mg
  Filled 2022-02-14 (×4): qty 10

## 2022-02-14 MED ORDER — PROPOFOL 1000 MG/100ML IV EMUL
0.0000 ug/kg/min | INTRAVENOUS | Status: DC
  Administered 2022-02-14: 22.222 ug/kg/min via INTRAVENOUS
  Filled 2022-02-14: qty 100

## 2022-02-14 MED ORDER — SODIUM CHLORIDE 0.9 % IV SOLN
2000.0000 mg | INTRAVENOUS | Status: AC
  Administered 2022-02-14: 2000 mg via INTRAVENOUS
  Filled 2022-02-14: qty 20

## 2022-02-14 MED ORDER — KETAMINE HCL 50 MG/ML IJ SOLN
200.0000 mg | Freq: Once | INTRAMUSCULAR | Status: AC
  Administered 2022-02-14: 200 mg via INTRAMUSCULAR
  Filled 2022-02-14: qty 10

## 2022-02-14 MED ORDER — PANTOPRAZOLE SODIUM 40 MG IV SOLR
40.0000 mg | Freq: Every day | INTRAVENOUS | Status: DC
  Administered 2022-02-14 – 2022-02-16 (×3): 40 mg via INTRAVENOUS
  Filled 2022-02-14 (×3): qty 10

## 2022-02-14 MED ORDER — PROPOFOL 1000 MG/100ML IV EMUL: 0.0000 ug/kg/min | INTRAVENOUS | Status: DC

## 2022-02-14 MED ORDER — FENTANYL CITRATE PF 50 MCG/ML IJ SOSY: 50.0000 ug | PREFILLED_SYRINGE | INTRAMUSCULAR | Status: DC | PRN

## 2022-02-14 MED ORDER — PANTOPRAZOLE SODIUM 40 MG PO TBEC: 40.0000 mg | DELAYED_RELEASE_TABLET | Freq: Every day | ORAL | Status: DC

## 2022-02-14 MED ORDER — LEVETIRACETAM IN NACL 500 MG/100ML IV SOLN: 500.0000 mg | Freq: Two times a day (BID) | INTRAVENOUS | Status: DC

## 2022-02-14 MED ORDER — ORAL CARE MOUTH RINSE
15.0000 mL | OROMUCOSAL | Status: DC | PRN
Start: 2022-02-14 — End: 2022-02-17

## 2022-02-14 MED ORDER — LORAZEPAM 2 MG/ML IJ SOLN
1.0000 mg | Freq: Once | INTRAMUSCULAR | Status: AC
  Administered 2022-02-14: 1 mg via INTRAVENOUS
  Filled 2022-02-14: qty 1

## 2022-02-14 MED ORDER — FENTANYL CITRATE PF 50 MCG/ML IJ SOSY
50.0000 ug | PREFILLED_SYRINGE | Freq: Once | INTRAMUSCULAR | Status: AC
  Administered 2022-02-14: 50 ug via INTRAVENOUS
  Filled 2022-02-14: qty 1

## 2022-02-14 MED ORDER — FENTANYL BOLUS VIA INFUSION
50.0000 ug | INTRAVENOUS | Status: DC | PRN
  Administered 2022-02-15: 25 ug via INTRAVENOUS

## 2022-02-14 MED ORDER — ONDANSETRON 4 MG PO TBDP: 4.0000 mg | ORAL_TABLET | Freq: Four times a day (QID) | ORAL | Status: DC | PRN

## 2022-02-14 MED ORDER — IOHEXOL 300 MG/ML  SOLN
100.0000 mL | Freq: Once | INTRAMUSCULAR | Status: AC | PRN
  Administered 2022-02-14: 100 mL via INTRAVENOUS

## 2022-02-14 MED ORDER — ROCURONIUM BROMIDE 50 MG/5ML IV SOLN
50.0000 mg | Freq: Once | INTRAVENOUS | Status: AC
Start: 2022-02-14 — End: 2022-02-14
  Administered 2022-02-14: 50 mg via INTRAVENOUS
  Filled 2022-02-14: qty 5

## 2022-02-14 MED ORDER — LORAZEPAM 2 MG/ML IJ SOLN: 1.0000 mg | Freq: Once | INTRAMUSCULAR | Status: DC

## 2022-02-14 MED ORDER — PROPOFOL 1000 MG/100ML IV EMUL
0.0000 ug/kg/min | INTRAVENOUS | Status: DC
  Administered 2022-02-14: 50 ug/kg/min via INTRAVENOUS
  Administered 2022-02-14 – 2022-02-15 (×3): 40 ug/kg/min via INTRAVENOUS
  Filled 2022-02-14 (×3): qty 100
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
## 2022-02-14 NOTE — Progress Notes (Signed)
RT assisted with patient transport on vent to CT and returned to ED room without complications.

## 2022-02-14 NOTE — Progress Notes (Signed)
LTM maint complete - no skin breakdown Serviced several leads. [facility] monitored, Event button test confirmed by [facility].  

## 2022-02-14 NOTE — ED Notes (Signed)
Pt to be intubated at this time.
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
## 2022-02-14 NOTE — Progress Notes (Signed)
Verbal order from Dr Freida Busman to push 50 of roc to assist Dr Susa Simmonds and his PA Verdon Cummins in reducing patient's hip. Patient on 200 mcg/hr of fentanyl and 50/hr of propofol. 100 mcg bolus of fentanyl given prior to pushing roc as per verbal order from Dr Freida Busman.  Provider Dr Susa Simmonds at bedside while pushing paralytic and during whole process. Patient unarousable to pain and vital signs are stable.  Sherral Hammers RN

## 2022-02-14 NOTE — Progress Notes (Signed)
RT assisted with patient transport on vent from ED to 4N24 without complications.

## 2022-02-14 NOTE — Progress Notes (Signed)
Orthopedic Tech Progress Note Patient Details:  COBIN CADAVID [birth year] [id number]  Musculoskeletal Traction Type of Traction: Bucks Skin Traction Traction Location: RLE Traction Weight: 20 lbs   Post Interventions Patient Tolerated: Well  Genelle Bal Jamari Diana 02/14/2022, 3:39 PM

## 2022-02-14 NOTE — ED Notes (Signed)
Deformity noted to the right femur, combative, dried blood to mouth with possible missing tooth.

## 2022-02-14 NOTE — Progress Notes (Signed)
LTM EEG hooked up and running - no initial skin breakdown - push button tested - neuro notified. [facility] monitoring.  
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
## 2022-02-14 NOTE — ED Notes (Signed)
Rapid bolus of 100 mcg of fentanyl IV to the RAC. Verbal order by dr. Renaye Rakers.
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
## 2022-02-14 NOTE — ED Notes (Signed)
1524 Reduction to the right hip by Dr. Renaye Rakers with assistance by Gerald Leitz NT and Ronald Lobo Tech.   1525 placed in [facility] tractions 20lbs

## 2022-02-14 NOTE — Progress Notes (Deleted)
tr

## 2022-02-14 NOTE — Progress Notes (Signed)
No seizures on stat EEG. Continue LTM with plan to dc in the AM if remains seizure free.  -- Milon Dikes, MD Neurologist [facility] Pager: [phone]

## 2022-02-14 NOTE — ED Notes (Signed)
Report given to Byrd Hesselbach, RN on [facility] ICU--
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
## 2022-02-14 NOTE — Progress Notes (Signed)
Pt arrived on the unit at 1700. Ortho at the bedside. (Sedation, pain med on max, rocuronium was given) Hip relocated manually,  knee fixator applied, pins are screwed in. Weights are hang 20lb. Xrays are done at the bedside.  Family arrived. Parents. Father speaks some english. All belongings were given to the parents. They are asked for the MD to call and update, using [place] language interpreter. Bed in low position, call bell in reach. Will continue to monitor

## 2022-02-14 NOTE — Progress Notes (Signed)
EEG complete - results pending 

## 2022-02-14 NOTE — Progress Notes (Signed)
Orthopedic Tech Progress Note Patient Details:  Jim [name] Aug 16, 1980 [id number] Level 2 Trauma  Patient ID: [name], male   DOB: 1980/10/03, 41 y.o.   MRN: [id number]  [name] 02/14/2022, 1:30 PM
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
## 2022-02-14 NOTE — Progress Notes (Signed)
Wet to place EEG, Per rn check back in 30 mins

## 2022-02-15 ENCOUNTER — Inpatient Hospital Stay (HOSPITAL_COMMUNITY): Payer: Medicaid Other

## 2022-02-15 DIAGNOSIS — R569 Unspecified convulsions: Secondary | ICD-10-CM | POA: Diagnosis not present

## 2022-02-15 LAB — TRIGLYCERIDES: Triglycerides: 64 mg/dL (ref <150)

## 2022-02-15 LAB — COMPREHENSIVE METABOLIC PANEL
ALT: 50 U/L — ABNORMAL HIGH (ref 0–44)
AST: 75 U/L — ABNORMAL HIGH (ref 15–41)
Albumin: 3.1 g/dL — ABNORMAL LOW (ref 3.5–5.0)
Alkaline Phosphatase: 75 U/L (ref 38–126)
Anion gap: 10 (ref 5–15)
BUN: 8 mg/dL (ref 6–20)
CO2: 21 mmol/L — ABNORMAL LOW (ref 22–32)
Calcium: 8.4 mg/dL — ABNORMAL LOW (ref 8.9–10.3)
Chloride: 110 mmol/L (ref 98–111)
Creatinine, Ser: 0.8 mg/dL (ref 0.61–1.24)
GFR, Estimated: >60 mL/min (ref >60)
Glucose, Bld: 86 mg/dL (ref 70–99)
Potassium: 3.5 mmol/L (ref 3.5–5.1)
Sodium: 141 mmol/L (ref 135–145)
Total Bilirubin: 0.9 mg/dL (ref 0.3–1.2)
Total Protein: 6.1 g/dL — ABNORMAL LOW (ref 6.5–8.1)

## 2022-02-15 LAB — HIV ANTIBODY (ROUTINE TESTING W REFLEX): HIV Screen 4th Generation wRfx: NONREACTIVE

## 2022-02-15 LAB — CBC
HCT: 39.4 % (ref 39.0–52.0)
Hemoglobin: 13.3 g/dL (ref 13.0–17.0)
MCH: 29.4 pg (ref 26.0–34.0)
MCHC: 33.8 g/dL (ref 30.0–36.0)
MCV: 87 fL (ref 80.0–100.0)
Platelets: 141 10*3/uL — ABNORMAL LOW (ref 150–400)
RBC: 4.53 MIL/uL (ref 4.22–5.81)
RDW: 12.6 % (ref 11.5–15.5)
WBC: 7 10*3/uL (ref 4.0–10.5)
nRBC: 0 % (ref 0.0–0.2)

## 2022-02-15 LAB — MAGNESIUM
Magnesium: 1.9 mg/dL (ref 1.7–2.4)
Magnesium: 1.8 mg/dL (ref 1.7–2.4)

## 2022-02-15 LAB — GLUCOSE, CAPILLARY
Glucose-Capillary: 84 mg/dL (ref 70–99)
Glucose-Capillary: 91 mg/dL (ref 70–99)
Glucose-Capillary: 162 mg/dL — ABNORMAL HIGH (ref 70–99)
Glucose-Capillary: 129 mg/dL — ABNORMAL HIGH (ref 70–99)

## 2022-02-15 LAB — PHOSPHORUS
Phosphorus: 3 mg/dL (ref 2.5–4.6)
Phosphorus: 3 mg/dL (ref 2.5–4.6)

## 2022-02-15 MED ORDER — VITAL HIGH PROTEIN PO LIQD
1000.0000 mL | ORAL | Status: DC
Start: 2022-02-15 — End: 2022-02-16
  Administered 2022-02-15: 1000 mL

## 2022-02-15 MED ORDER — INSULIN ASPART 100 UNIT/ML IJ SOLN
0.0000 [IU] | INTRAMUSCULAR | Status: DC
  Administered 2022-02-15: 3 [IU] via SUBCUTANEOUS
  Administered 2022-02-16 – 2022-02-17 (×2): 2 [IU] via SUBCUTANEOUS

## 2022-02-15 MED ORDER — PROSOURCE TF PO LIQD
45.0000 mL | Freq: Two times a day (BID) | ORAL | Status: DC
  Administered 2022-02-15 (×2): 45 mL
  Filled 2022-02-15 (×2): qty 45

## 2022-02-15 MED ORDER — DEXMEDETOMIDINE HCL IN NACL 400 MCG/100ML IV SOLN
0.0000 ug/kg/h | INTRAVENOUS | Status: DC
  Administered 2022-02-15 – 2022-02-17 (×3): 0.4 ug/kg/h via INTRAVENOUS
  Filled 2022-02-15 (×6): qty 100

## 2022-02-15 NOTE — Progress Notes (Signed)
Cspine cleared per MD S. Allen. C-collar removed.
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
## 2022-02-15 NOTE — Progress Notes (Signed)
Wean on vent attempted. Patient did not tolerate and became apneic. Patient placed back on full support.

## 2022-02-15 NOTE — Progress Notes (Signed)
Arrived to room patient is gone for CT will coming back for DC LTM EEG

## 2022-02-15 NOTE — Progress Notes (Signed)
Orthopedic Tech Progress Note Patient Details:  Jim [name] 01-20-1981 [id number]  Patient ID: Jim [name], male   DOB: [birth year], 41 y.o.   MRN: [id number] Traction removed so patient could be taken to CT, will reapply once he is back in his room.  Darleen Crocker 02/15/2022, 8:52 AM

## 2022-02-15 NOTE — Progress Notes (Signed)
Transported on the vent to CT and back. Patient on full support at this time. No complications noted.
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
## 2022-02-15 NOTE — Progress Notes (Signed)
I called patient's father Jim [name] and updated him on the patient's current condition with the assistance of a [place] interpreter via phone. All questions were answered.

## 2022-02-15 NOTE — Progress Notes (Signed)
Orthopedic Tech Progress Note Patient Details:  JAVEL HERSH 18-Nov-1980 [id number]  Musculoskeletal Traction Type of Traction: Skeletal (Balanced Suspension) Traction Location: RLE Traction Weight: 20 lbs   Post Interventions Patient Tolerated: Well Traction applied following patients return from CT.  Darleen Crocker 02/15/2022, 10:31 AM

## 2022-02-16 ENCOUNTER — Encounter (HOSPITAL_COMMUNITY): Payer: Self-pay

## 2022-02-16 ENCOUNTER — Inpatient Hospital Stay (HOSPITAL_COMMUNITY): Payer: Medicaid Other

## 2022-02-16 ENCOUNTER — Inpatient Hospital Stay: Payer: Self-pay

## 2022-02-16 LAB — BASIC METABOLIC PANEL
Anion gap: 11 (ref 5–15)
BUN: 10 mg/dL (ref 6–20)
CO2: 22 mmol/L (ref 22–32)
Calcium: 8 mg/dL — ABNORMAL LOW (ref 8.9–10.3)
Chloride: 109 mmol/L (ref 98–111)
Creatinine, Ser: 0.75 mg/dL (ref 0.61–1.24)
GFR, Estimated: >60 mL/min (ref >60)
Glucose, Bld: 144 mg/dL — ABNORMAL HIGH (ref 70–99)
Potassium: 3.5 mmol/L (ref 3.5–5.1)
Sodium: 142 mmol/L (ref 135–145)

## 2022-02-16 LAB — MAGNESIUM
Magnesium: 2.1 mg/dL (ref 1.7–2.4)
Magnesium: 2 mg/dL (ref 1.7–2.4)

## 2022-02-16 LAB — GLUCOSE, CAPILLARY
Glucose-Capillary: 113 mg/dL — ABNORMAL HIGH (ref 70–99)
Glucose-Capillary: 120 mg/dL — ABNORMAL HIGH (ref 70–99)
Glucose-Capillary: 86 mg/dL (ref 70–99)
Glucose-Capillary: 90 mg/dL (ref 70–99)
Glucose-Capillary: 99 mg/dL (ref 70–99)

## 2022-02-16 LAB — CBC
HCT: 31.2 % — ABNORMAL LOW (ref 39.0–52.0)
Hemoglobin: 10.7 g/dL — ABNORMAL LOW (ref 13.0–17.0)
MCH: 30.7 pg (ref 26.0–34.0)
MCHC: 34.3 g/dL (ref 30.0–36.0)
MCV: 89.7 fL (ref 80.0–100.0)
Platelets: 110 10*3/uL — ABNORMAL LOW (ref 150–400)
RBC: 3.48 MIL/uL — ABNORMAL LOW (ref 4.22–5.81)
RDW: 12.7 % (ref 11.5–15.5)
WBC: 11.3 10*3/uL — ABNORMAL HIGH (ref 4.0–10.5)
nRBC: 0 % (ref 0.0–0.2)

## 2022-02-16 LAB — PHOSPHORUS
Phosphorus: 2.3 mg/dL — ABNORMAL LOW (ref 2.5–4.6)
Phosphorus: 2 mg/dL — ABNORMAL LOW (ref 2.5–4.6)

## 2022-02-16 MED ORDER — SODIUM CHLORIDE 0.9% FLUSH: 10.0000 mL | INTRAVENOUS | Status: DC | PRN

## 2022-02-16 MED ORDER — SODIUM CHLORIDE 0.9% FLUSH
10.0000 mL | Freq: Two times a day (BID) | INTRAVENOUS | Status: DC
  Administered 2022-02-16: 20 mL
  Administered 2022-02-16 – 2022-02-18 (×4): 10 mL
  Administered 2022-02-18: 20 mL
  Administered 2022-02-19 – 2022-02-20 (×3): 10 mL

## 2022-02-16 MED ORDER — PIVOT 1.5 CAL PO LIQD: 1000.0000 mL | ORAL | Status: DC

## 2022-02-16 MED ORDER — METHOCARBAMOL 1000 MG/10ML IJ SOLN
1000.0000 mg | Freq: Three times a day (TID) | INTRAVENOUS | Status: DC
  Administered 2022-02-16 – 2022-02-18 (×5): 1000 mg via INTRAVENOUS
  Filled 2022-02-16: qty 10
  Filled 2022-02-16 (×3): qty 1000
  Filled 2022-02-16 (×2): qty 10
  Filled 2022-02-16: qty 1000

## 2022-02-16 MED ORDER — OXYCODONE HCL 5 MG/5ML PO SOLN: 5.0000 mg | ORAL | Status: DC | PRN

## 2022-02-16 MED ORDER — MORPHINE SULFATE (PF) 2 MG/ML IV SOLN
2.0000 mg | INTRAVENOUS | Status: DC | PRN
  Administered 2022-02-17 – 2022-02-18 (×9): 4 mg via INTRAVENOUS
  Filled 2022-02-16 (×9): qty 2

## 2022-02-16 MED ORDER — ACETAMINOPHEN 10 MG/ML IV SOLN
1000.0000 mg | Freq: Four times a day (QID) | INTRAVENOUS | Status: AC
  Administered 2022-02-16 – 2022-02-17 (×4): 1000 mg via INTRAVENOUS
  Filled 2022-02-16 (×4): qty 100

## 2022-02-16 NOTE — Progress Notes (Signed)
RT called to bedside due to patient self extubating. Patient stable at this time.
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
## 2022-02-16 NOTE — Progress Notes (Signed)
1035 Another RN called out to say pt self extubated. This RN entered to find patient without distress, vitals stable, O2 mid 90s on room air. Patient placed on 3L [place]. MD Lovick to bedside and RT called to bedside. Patient oriented x3.

## 2022-02-16 NOTE — Progress Notes (Signed)
Peripherally Inserted Central Catheter Placement  The IV Nurse has discussed with the patient and/or persons authorized to consent for the patient, the purpose of this procedure and the potential benefits and risks involved with this procedure.  The benefits include less needle sticks, lab draws from the catheter, and the patient may be discharged home with the catheter. Risks include, but not limited to, infection, bleeding, blood clot (thrombus formation), and puncture of an artery; nerve damage and irregular heartbeat and possibility to perform a PICC exchange if needed/ordered by physician.  Alternatives to this procedure were also discussed.  Bard Power PICC patient education guide, fact sheet on infection prevention and patient information card has been provided to patient /or left at bedside.    Consent obtained by father with interpreter via telephone  PICC Placement Documentation  PICC Triple Lumen 02/16/22 Right Brachial 39 cm 0 cm (Active)  Indication for Insertion or Continuance of Line Prolonged intravenous therapies 02/16/22 0900  Exposed Catheter (cm) 0 cm 02/16/22 0900  Site Assessment Clean, Dry, Intact 02/16/22 0900  Lumen #1 Status Flushed;Saline locked;Blood return noted 02/16/22 0900  Lumen #2 Status Flushed;Saline locked;Blood return noted 02/16/22 0900  Lumen #3 Status Flushed;Saline locked;Blood return noted 02/16/22 0900  Dressing Type Transparent 02/16/22 0900  Dressing Status Antimicrobial disc in place 02/16/22 0900  Line Care Connections checked and tightened 02/16/22 0900  Dressing Intervention New dressing 02/16/22 0900  Dressing Change Due 02/23/22 02/16/22 0900       Franne Grip Renee 02/16/2022, 9:50 AM
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
## 2022-02-16 NOTE — Procedures (Signed)
Cortrak ? ?Person Inserting Tube:  Karel Turpen L, RD ?Tube Type:  Cortrak - 43 inches ?Tube Size:  10 ?Tube Location:  Left nare ?Secured by: Bridle ?Technique Used to Measure Tube Placement:  Marking at nare/corner of mouth ?Cortrak Secured At:  70 cm ? ?Cortrak Tube Team Note: ? ?Consult received to place a Cortrak feeding tube.  ? ?X-ray is required, abdominal x-ray has been ordered by the Cortrak team. Please confirm tube placement before using the Cortrak tube.  ? ?If the tube becomes dislodged please keep the tube and contact the Cortrak team at [email or web address] (password [email or web address]) for replacement.  ?If after hours and replacement cannot be delayed, place a NG tube and confirm placement with an abdominal x-ray.  ? ? ?Jim [name] RD, LDN ?Clinical Dietitian ?See AMiON for contact information.  ? ? ?

## 2022-02-16 NOTE — Progress Notes (Signed)
     Jim [name] is a 41 y.o. male   Orthopaedic diagnosis: Right posterior wall acetabulum fracture with posterior hip dislocation  Procedure 02/14/22 : Closed treatment of acetabulum fracture dislocation with manipulation with insertion of traction pin for skeletal traction  Subjective: Patient remains intubated.  Per nursing staff, he did have traction briefly removed for a stat Head scan yesterday.  Traction was then replaced.  There has been attempts to extubate the patient but he has not tolerated this.  Objectyive: Vitals:   02/16/22 0742 02/16/22 0800  BP:    Pulse:    Resp:    Temp:  98.9 F (37.2 C)  SpO2: 100%      Exam: Awake and alert Respirations even and unlabored No acute distress  Exam of the right lower leg demonstrates no obvious gross deformity to the hip.  Xeroform and gauze remain at the pin sites.  Pin sites remain in a month benign.  There is no obvious skin necrosis or ongoing bleeding.  Traction in place.  He has a palpable pedal pulse and the toes are warm and well perfused distally.  Assessment: Right posterior wall acetabulum fracture with posterior hip dislocation status post closed reduction and skeletal traction placement 02/14/2022   Plan: Patient will require ORIF for this fracture as definitive treatment.  We will discuss this with what the orthopedic trauma team.  For now, continue pin site care and maintain traction.   Sascha Palma J. [place], PA-C
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
## 2022-02-17 ENCOUNTER — Inpatient Hospital Stay (HOSPITAL_COMMUNITY): Payer: Medicaid Other

## 2022-02-17 ENCOUNTER — Encounter (HOSPITAL_COMMUNITY): Admission: EM | Discharge status: Against Medical Advice | Payer: Self-pay | Source: Home / Self Care

## 2022-02-17 ENCOUNTER — Inpatient Hospital Stay (HOSPITAL_COMMUNITY): Payer: Medicaid Other | Admitting: Certified Registered Nurse Anesthetist

## 2022-02-17 DIAGNOSIS — S32461A Displaced associated transverse-posterior fracture of right acetabulum, initial encounter for closed fracture: Secondary | ICD-10-CM | POA: Diagnosis not present

## 2022-02-17 DIAGNOSIS — G709 Myoneural disorder, unspecified: Secondary | ICD-10-CM | POA: Diagnosis not present

## 2022-02-17 DIAGNOSIS — Z87891 Personal history of nicotine dependence: Secondary | ICD-10-CM | POA: Diagnosis not present

## 2022-02-17 HISTORY — PX: ORIF ACETABULAR FRACTURE: SHX5029

## 2022-02-17 LAB — BASIC METABOLIC PANEL
Anion gap: 6 (ref 5–15)
BUN: 5 mg/dL — ABNORMAL LOW (ref 6–20)
CO2: 21 mmol/L — ABNORMAL LOW (ref 22–32)
Calcium: 7.8 mg/dL — ABNORMAL LOW (ref 8.9–10.3)
Chloride: 113 mmol/L — ABNORMAL HIGH (ref 98–111)
Creatinine, Ser: 0.67 mg/dL (ref 0.61–1.24)
GFR, Estimated: >60 mL/min (ref >60)
Glucose, Bld: 114 mg/dL — ABNORMAL HIGH (ref 70–99)
Potassium: 3.1 mmol/L — ABNORMAL LOW (ref 3.5–5.1)
Sodium: 140 mmol/L (ref 135–145)

## 2022-02-17 LAB — CBC
HCT: 31.4 % — ABNORMAL LOW (ref 39.0–52.0)
Hemoglobin: 10.5 g/dL — ABNORMAL LOW (ref 13.0–17.0)
MCH: 29.7 pg (ref 26.0–34.0)
MCHC: 33.4 g/dL (ref 30.0–36.0)
MCV: 88.7 fL (ref 80.0–100.0)
Platelets: 111 10*3/uL — ABNORMAL LOW (ref 150–400)
RBC: 3.54 MIL/uL — ABNORMAL LOW (ref 4.22–5.81)
RDW: 12.3 % (ref 11.5–15.5)
WBC: 10.7 10*3/uL — ABNORMAL HIGH (ref 4.0–10.5)
nRBC: 0 % (ref 0.0–0.2)

## 2022-02-17 LAB — GLUCOSE, CAPILLARY
Glucose-Capillary: 105 mg/dL — ABNORMAL HIGH (ref 70–99)
Glucose-Capillary: 107 mg/dL — ABNORMAL HIGH (ref 70–99)
Glucose-Capillary: 110 mg/dL — ABNORMAL HIGH (ref 70–99)
Glucose-Capillary: 125 mg/dL — ABNORMAL HIGH (ref 70–99)
Glucose-Capillary: 94 mg/dL (ref 70–99)

## 2022-02-17 LAB — ABO/RH: ABO/RH(D): B POS

## 2022-02-17 LAB — PREPARE RBC (CROSSMATCH)

## 2022-02-17 SURGERY — OPEN REDUCTION INTERNAL FIXATION (ORIF) ACETABULAR FRACTURE
Anesthesia: General | Site: Hip | Laterality: Right

## 2022-02-17 MED ORDER — TRANEXAMIC ACID-NACL 1000-0.7 MG/100ML-% IV SOLN
1000.0000 mg | Freq: Once | INTRAVENOUS | Status: AC
  Administered 2022-02-17: 1000 mg via INTRAVENOUS
  Filled 2022-02-17: qty 100

## 2022-02-17 MED ORDER — ROCURONIUM BROMIDE 10 MG/ML (PF) SYRINGE
PREFILLED_SYRINGE | INTRAVENOUS | Status: AC
  Filled 2022-02-17: qty 50

## 2022-02-17 MED ORDER — ONDANSETRON HCL 4 MG/2ML IJ SOLN
INTRAMUSCULAR | Status: DC | PRN
  Administered 2022-02-17: 4 mg via INTRAVENOUS

## 2022-02-17 MED ORDER — DOCUSATE SODIUM 100 MG PO CAPS
100.0000 mg | ORAL_CAPSULE | Freq: Two times a day (BID) | ORAL | Status: DC
  Administered 2022-02-18 – 2022-02-20 (×6): 100 mg via ORAL
  Filled 2022-02-17 (×6): qty 1

## 2022-02-17 MED ORDER — PROPOFOL 10 MG/ML IV BOLUS
INTRAVENOUS | Status: DC | PRN
  Administered 2022-02-17: 120 mg via INTRAVENOUS

## 2022-02-17 MED ORDER — AMISULPRIDE (ANTIEMETIC) 5 MG/2ML IV SOLN: 10.0000 mg | Freq: Once | INTRAVENOUS | Status: DC | PRN

## 2022-02-17 MED ORDER — METOCLOPRAMIDE HCL 5 MG/ML IJ SOLN: 5.0000 mg | Freq: Three times a day (TID) | INTRAMUSCULAR | Status: DC | PRN

## 2022-02-17 MED ORDER — MIDAZOLAM HCL 2 MG/2ML IJ SOLN
INTRAMUSCULAR | Status: DC | PRN
  Administered 2022-02-17: 1 mg via INTRAVENOUS

## 2022-02-17 MED ORDER — LACTATED RINGERS IV SOLN: INTRAVENOUS | Status: DC | PRN

## 2022-02-17 MED ORDER — HYDROMORPHONE HCL 1 MG/ML IJ SOLN: 0.2500 mg | INTRAMUSCULAR | Status: DC | PRN

## 2022-02-17 MED ORDER — POTASSIUM PHOSPHATES 15 MMOLE/5ML IV SOLN
30.0000 mmol | Freq: Once | INTRAVENOUS | Status: AC
  Administered 2022-02-17: 30 mmol via INTRAVENOUS
  Filled 2022-02-17: qty 10

## 2022-02-17 MED ORDER — PHENYLEPHRINE HCL-NACL 20-0.9 MG/250ML-% IV SOLN
INTRAVENOUS | Status: DC | PRN
  Administered 2022-02-17: 20 ug/min via INTRAVENOUS

## 2022-02-17 MED ORDER — FENTANYL CITRATE (PF) 250 MCG/5ML IJ SOLN
INTRAMUSCULAR | Status: AC
  Filled 2022-02-17: qty 5

## 2022-02-17 MED ORDER — DEXAMETHASONE SODIUM PHOSPHATE 10 MG/ML IJ SOLN
INTRAMUSCULAR | Status: DC | PRN
  Administered 2022-02-17: 10 mg via INTRAVENOUS

## 2022-02-17 MED ORDER — MEPERIDINE HCL 25 MG/ML IJ SOLN: 6.2500 mg | INTRAMUSCULAR | Status: DC | PRN

## 2022-02-17 MED ORDER — ALBUMIN HUMAN 5 % IV SOLN: INTRAVENOUS | Status: DC | PRN

## 2022-02-17 MED ORDER — POLYETHYLENE GLYCOL 3350 17 G PO PACK
17.0000 g | PACK | Freq: Every day | ORAL | Status: DC | PRN
Start: 2022-02-17 — End: 2022-02-21

## 2022-02-17 MED ORDER — CEFAZOLIN SODIUM-DEXTROSE 2-3 GM-%(50ML) IV SOLR
INTRAVENOUS | Status: DC | PRN
  Administered 2022-02-17: 2 g via INTRAVENOUS

## 2022-02-17 MED ORDER — CEFAZOLIN SODIUM-DEXTROSE 2-4 GM/100ML-% IV SOLN
2.0000 g | Freq: Three times a day (TID) | INTRAVENOUS | Status: AC
  Administered 2022-02-17 – 2022-02-18 (×3): 2 g via INTRAVENOUS
  Filled 2022-02-17 (×3): qty 100

## 2022-02-17 MED ORDER — SUGAMMADEX SODIUM 200 MG/2ML IV SOLN
INTRAVENOUS | Status: DC | PRN
  Administered 2022-02-17: 200 mg via INTRAVENOUS

## 2022-02-17 MED ORDER — METHOCARBAMOL 1000 MG/10ML IJ SOLN
500.0000 mg | Freq: Four times a day (QID) | INTRAVENOUS | Status: DC | PRN
  Filled 2022-02-17: qty 5

## 2022-02-17 MED ORDER — MIDAZOLAM HCL 2 MG/2ML IJ SOLN
INTRAMUSCULAR | Status: AC
  Filled 2022-02-17: qty 4

## 2022-02-17 MED ORDER — METHOCARBAMOL 500 MG PO TABS: 500.0000 mg | ORAL_TABLET | Freq: Four times a day (QID) | ORAL | Status: DC | PRN

## 2022-02-17 MED ORDER — KETAMINE HCL 50 MG/5ML IJ SOSY
PREFILLED_SYRINGE | INTRAMUSCULAR | Status: AC
  Filled 2022-02-17: qty 5

## 2022-02-17 MED ORDER — PHENYLEPHRINE 80 MCG/ML (10ML) SYRINGE FOR IV PUSH (FOR BLOOD PRESSURE SUPPORT)
PREFILLED_SYRINGE | INTRAVENOUS | Status: DC | PRN
  Administered 2022-02-17 (×3): 80 ug via INTRAVENOUS
  Administered 2022-02-17: 160 ug via INTRAVENOUS
  Administered 2022-02-17: 80 ug via INTRAVENOUS

## 2022-02-17 MED ORDER — FENTANYL CITRATE (PF) 250 MCG/5ML IJ SOLN
INTRAMUSCULAR | Status: DC | PRN
Start: 2022-02-17 — End: 2022-02-17
  Administered 2022-02-17: 50 ug via INTRAVENOUS
  Administered 2022-02-17: 100 ug via INTRAVENOUS
  Administered 2022-02-17 (×2): 50 ug via INTRAVENOUS

## 2022-02-17 MED ORDER — CEFAZOLIN SODIUM-DEXTROSE 2-4 GM/100ML-% IV SOLN
INTRAVENOUS | Status: AC
  Filled 2022-02-17: qty 100

## 2022-02-17 MED ORDER — ORAL CARE MOUTH RINSE: 15.0000 mL | OROMUCOSAL | Status: DC | PRN

## 2022-02-17 MED ORDER — PROMETHAZINE HCL 25 MG/ML IJ SOLN: 6.2500 mg | INTRAMUSCULAR | Status: DC | PRN

## 2022-02-17 MED ORDER — ROCURONIUM BROMIDE 10 MG/ML (PF) SYRINGE
PREFILLED_SYRINGE | INTRAVENOUS | Status: DC | PRN
  Administered 2022-02-17: 40 mg via INTRAVENOUS
  Administered 2022-02-17: 60 mg via INTRAVENOUS
  Administered 2022-02-17: 40 mg via INTRAVENOUS

## 2022-02-17 MED ORDER — METOCLOPRAMIDE HCL 5 MG PO TABS: 5.0000 mg | ORAL_TABLET | Freq: Three times a day (TID) | ORAL | Status: DC | PRN

## 2022-02-17 MED ORDER — 0.9 % SODIUM CHLORIDE (POUR BTL) OPTIME
TOPICAL | Status: DC | PRN
  Administered 2022-02-17: 1000 mL

## 2022-02-17 SURGICAL SUPPLY — 66 items
BAG COUNTER SPONGE SURGICOUNT (BAG) ×2 IMPLANT
BAG SPNG CNTER NS LX DISP (BAG) ×1
BIT DRILL AO MATTA 2.5MX230M (BIT) IMPLANT
BRUSH SCRUB EZ PLAIN DRY (MISCELLANEOUS) ×4 IMPLANT
COVER SURGICAL LIGHT HANDLE (MISCELLANEOUS) ×2 IMPLANT
DRAPE C-ARM 42X72 X-RAY (DRAPES) ×2 IMPLANT
DRAPE C-ARMOR (DRAPES) ×2 IMPLANT
DRAPE INCISE IOBAN 66X45 STRL (DRAPES) ×3 IMPLANT
DRAPE INCISE IOBAN 85X60 (DRAPES) ×2 IMPLANT
DRAPE ORTHO SPLIT 77X108 STRL (DRAPES) ×4
DRAPE SURG ORHT 6 SPLT 77X108 (DRAPES) ×2 IMPLANT
DRAPE U-SHAPE 47X51 STRL (DRAPES) ×2 IMPLANT
DRILL BIT AO MATTA 2.5MX230M (BIT) ×2
DRSG MEPILEX POST OP 4X12 (GAUZE/BANDAGES/DRESSINGS) ×1 IMPLANT
ELECT BLADE 6.5 EXT (BLADE) ×2 IMPLANT
ELECT REM PT RETURN 9FT ADLT (ELECTROSURGICAL) ×2
ELECTRODE REM PT RTRN 9FT ADLT (ELECTROSURGICAL) ×1 IMPLANT
GLOVE BIO SURGEON STRL SZ7.5 (GLOVE) ×2 IMPLANT
GLOVE BIO SURGEON STRL SZ8 (GLOVE) ×4 IMPLANT
GLOVE BIOGEL PI IND STRL 7.5 (GLOVE) ×1 IMPLANT
GLOVE BIOGEL PI IND STRL 8 (GLOVE) ×1 IMPLANT
GLOVE BIOGEL PI INDICATOR 7.5 (GLOVE) ×2
GLOVE BIOGEL PI INDICATOR 8 (GLOVE) ×2
GLOVE SURG ORTHO LTX SZ7.5 (GLOVE) ×4 IMPLANT
GOWN STRL REUS W/ TWL LRG LVL3 (GOWN DISPOSABLE) ×2 IMPLANT
GOWN STRL REUS W/ TWL XL LVL3 (GOWN DISPOSABLE) ×2 IMPLANT
GOWN STRL REUS W/TWL LRG LVL3 (GOWN DISPOSABLE) ×4
GOWN STRL REUS W/TWL XL LVL3 (GOWN DISPOSABLE) ×4
HANDPIECE INTERPULSE COAX TIP (DISPOSABLE) ×2
KIT BASIN OR (CUSTOM PROCEDURE TRAY) ×2 IMPLANT
KIT TURNOVER KIT B (KITS) ×2 IMPLANT
MANIFOLD NEPTUNE II (INSTRUMENTS) ×2 IMPLANT
NS IRRIG 1000ML POUR BTL (IV SOLUTION) ×2 IMPLANT
PACK TOTAL JOINT (CUSTOM PROCEDURE TRAY) ×2 IMPLANT
PAD ARMBOARD 7.5X6 YLW CONV (MISCELLANEOUS) ×4 IMPLANT
PIN APEX 6X180MM EXFIX (EXFIX) ×1 IMPLANT
PLATE ACET STRT 94.5M 8H (Plate) ×1 IMPLANT
PLATE BONE MATTA 10.5X58.5 H5 (Plate) ×1 IMPLANT
RETRIEVER SUT HEWSON (MISCELLANEOUS) ×2 IMPLANT
SCREW CORTEX ST MATTA 3.5X14 (Screw) ×1 IMPLANT
SCREW CORTEX ST MATTA 3.5X20 (Screw) ×1 IMPLANT
SCREW CORTEX ST MATTA 3.5X24 (Screw) ×1 IMPLANT
SCREW CORTEX ST MATTA 3.5X34MM (Screw) ×2 IMPLANT
SCREW CORTEX ST MATTA 3.5X36MM (Screw) ×1 IMPLANT
SCREW CORTEX ST MATTA 3.5X38M (Screw) ×1 IMPLANT
SCREW CORTICAL 3.5X42MM (Screw) ×1 IMPLANT
SET HNDPC FAN SPRY TIP SCT (DISPOSABLE) ×1 IMPLANT
SPONGE T-LAP 18X18 ~~LOC~~+RFID (SPONGE) ×3 IMPLANT
STAPLER VISISTAT 35W (STAPLE) ×2 IMPLANT
STRIP CLOSURE SKIN 1/2X4 (GAUZE/BANDAGES/DRESSINGS) ×2 IMPLANT
SUCTION FRAZIER HANDLE 10FR (MISCELLANEOUS) ×2
SUCTION TUBE FRAZIER 10FR DISP (MISCELLANEOUS) ×1 IMPLANT
SUT ETHILON 2 0 PSLX (SUTURE) ×6 IMPLANT
SUT FIBERWIRE #2 38 T-5 BLUE (SUTURE) ×8
SUT VIC AB 0 CT1 27 (SUTURE) ×2
SUT VIC AB 0 CT1 27XBRD ANBCTR (SUTURE) ×1 IMPLANT
SUT VIC AB 1 CT1 18XCR BRD 8 (SUTURE) ×1 IMPLANT
SUT VIC AB 1 CT1 27 (SUTURE) ×2
SUT VIC AB 1 CT1 27XBRD ANBCTR (SUTURE) ×1 IMPLANT
SUT VIC AB 1 CT1 8-18 (SUTURE) ×2
SUT VIC AB 2-0 CT1 27 (SUTURE) ×2
SUT VIC AB 2-0 CT1 TAPERPNT 27 (SUTURE) ×1 IMPLANT
SUTURE FIBERWR #2 38 T-5 BLUE (SUTURE) ×2 IMPLANT
TOWEL GREEN STERILE (TOWEL DISPOSABLE) ×4 IMPLANT
TOWEL GREEN STERILE FF (TOWEL DISPOSABLE) ×4 IMPLANT
WATER STERILE IRR 1000ML POUR (IV SOLUTION) ×1 IMPLANT
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
## 2022-02-17 NOTE — Plan of Care (Signed)
Pt restless, manages removing equipment, even  with mitts and restraints. Precedex started.Oriented to self only, can answer some questions. Moans in pain frequently/PRN given. Bed in low position, alarms are on, traction/ 20 lb weight. Will go to OR today

## 2022-02-17 NOTE — Anesthesia Preprocedure Evaluation (Addendum)
Anesthesia Evaluation  Patient identified by MRN, date of birth, ID band Patient unresponsive    Reviewed: Allergy & Precautions, NPO status , Patient's Chart, lab work & pertinent test results  Airway Mallampati: II  TM Distance: >3 FB Neck ROM: Full    Dental  (+) Partial Upper, Missing, Dental Advisory Given, Poor Dentition   Pulmonary former smoker,    Pulmonary exam normal breath sounds clear to auscultation       Cardiovascular negative cardio ROS Normal cardiovascular exam Rhythm:Regular Rate:Normal     Neuro/Psych  Neuromuscular disease    GI/Hepatic negative GI ROS, (+)     substance abuse  ,   Endo/Other  negative endocrine ROS  Renal/GU negative Renal ROS     Musculoskeletal negative musculoskeletal ROS (+)   Abdominal   Peds  Hematology negative hematology ROS (+)   Anesthesia Other Findings   Reproductive/Obstetrics                           Anesthesia Physical Anesthesia Plan  ASA: 3  Anesthesia Plan: General   Post-op Pain Management: Precedex, Ofirmev IV (intra-op)* and Toradol IV (intra-op)*   Induction: Intravenous  PONV Risk Score and Plan: 3 and Treatment may vary due to age or medical condition, Ondansetron, Dexamethasone and Midazolam  Airway Management Planned: Oral ETT  Additional Equipment: None  Intra-op Plan:   Post-operative Plan: Possible Post-op intubation/ventilation  Informed Consent: I have reviewed the patients History and Physical, chart, labs and discussed the procedure including the risks, benefits and alternatives for the proposed anesthesia with the patient or authorized representative who has indicated his/her understanding and acceptance.     Dental advisory given  Plan Discussed with: CRNA  Anesthesia Plan Comments:      Anesthesia Quick Evaluation

## 2022-02-17 NOTE — Progress Notes (Signed)
SLP Cancellation Note  Patient Details Name: KADE DEMICCO MRN: [id number] DOB: 1981-01-02   Cancelled treatment:       Reason Eval/Treat Not Completed: Patient at procedure or test/unavailable;Patient's level of consciousness. Pt sedated now and also going to OR later today. SLP will check in tomorrow.    Mahlon Gabrielle, Riley Nearing 02/17/2022, 11:49 AM

## 2022-02-17 NOTE — Anesthesia Procedure Notes (Signed)
Procedure Name: Intubation Date/Time: 02/17/2022 1:41 PM  Performed by: Lance Coon, CRNAPre-anesthesia Checklist: Patient identified, Emergency Drugs available, Suction available, Patient being monitored and Timeout performed Patient Re-evaluated:Patient Re-evaluated prior to induction Oxygen Delivery Method: Circle system utilized Preoxygenation: Pre-oxygenation with 100% oxygen Induction Type: IV induction Ventilation: Mask ventilation without difficulty Laryngoscope Size: Mac and 4 Grade View: Grade I Tube type: Oral Tube size: 7.5 mm Number of attempts: 1 Airway Equipment and Method: Stylet Placement Confirmation: ETT inserted through vocal cords under direct vision, positive ETCO2 and breath sounds checked- equal and bilateral Secured at: 21 cm Tube secured with: Tape Dental Injury: Teeth and Oropharynx as per pre-operative assessment
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
## 2022-02-17 NOTE — Transfer of Care (Signed)
Immediate Anesthesia Transfer of Care Note  Patient: Jim [name]  Procedure(s) Performed: OPEN REDUCTION  INTERNAL FIXATION (ORIF) ACETABULAR FRACTURE (Right: Hip)  Patient Location: PACU  Anesthesia Type:General  Level of Consciousness: drowsy  Airway & Oxygen Therapy: Patient Spontanous Breathing and Patient connected to face mask oxygen  Post-op Assessment: Report given to RN and Post -op Vital signs reviewed and stable  Post vital signs: Reviewed and stable  Last Vitals:  Vitals Value Taken Time  BP 111/80 02/17/22 1715  Temp 36.8 C 02/17/22 1715  Pulse 81 02/17/22 1721  Resp 20 02/17/22 1721  SpO2 100 % 02/17/22 1721  Vitals shown include unvalidated device data.  Last Pain:  Vitals:   02/17/22 1715  TempSrc:   PainSc: Asleep      Patients Stated Pain Goal: 0 (02/17/22 0321)  Complications: No notable events documented.

## 2022-02-18 DIAGNOSIS — S32401A Unspecified fracture of right acetabulum, initial encounter for closed fracture: Secondary | ICD-10-CM | POA: Insufficient documentation

## 2022-02-18 LAB — CBC
HCT: 27.8 % — ABNORMAL LOW (ref 39.0–52.0)
Hemoglobin: 9.5 g/dL — ABNORMAL LOW (ref 13.0–17.0)
MCH: 30.3 pg (ref 26.0–34.0)
MCHC: 34.2 g/dL (ref 30.0–36.0)
MCV: 88.5 fL (ref 80.0–100.0)
Platelets: 140 10*3/uL — ABNORMAL LOW (ref 150–400)
RBC: 3.14 MIL/uL — ABNORMAL LOW (ref 4.22–5.81)
RDW: 12.6 % (ref 11.5–15.5)
WBC: 8.6 10*3/uL (ref 4.0–10.5)
nRBC: 0 % (ref 0.0–0.2)

## 2022-02-18 LAB — BASIC METABOLIC PANEL
Anion gap: 6 (ref 5–15)
BUN: 6 mg/dL (ref 6–20)
CO2: 22 mmol/L (ref 22–32)
Calcium: 7.4 mg/dL — ABNORMAL LOW (ref 8.9–10.3)
Chloride: 112 mmol/L — ABNORMAL HIGH (ref 98–111)
Creatinine, Ser: 0.6 mg/dL — ABNORMAL LOW (ref 0.61–1.24)
GFR, Estimated: >60 mL/min (ref >60)
Glucose, Bld: 103 mg/dL — ABNORMAL HIGH (ref 70–99)
Potassium: 3.3 mmol/L — ABNORMAL LOW (ref 3.5–5.1)
Sodium: 140 mmol/L (ref 135–145)

## 2022-02-18 LAB — PHOSPHORUS: Phosphorus: 2.7 mg/dL (ref 2.5–4.6)

## 2022-02-18 LAB — GLUCOSE, CAPILLARY
Glucose-Capillary: 101 mg/dL — ABNORMAL HIGH (ref 70–99)
Glucose-Capillary: 89 mg/dL (ref 70–99)

## 2022-02-18 LAB — VITAMIN D 25 HYDROXY (VIT D DEFICIENCY, FRACTURES): Vit D, 25-Hydroxy: 22.33 ng/mL — ABNORMAL LOW (ref 30–100)

## 2022-02-18 MED ORDER — LEVETIRACETAM 500 MG PO TABS
500.0000 mg | ORAL_TABLET | Freq: Two times a day (BID) | ORAL | Status: DC
  Administered 2022-02-18 – 2022-02-20 (×6): 500 mg via ORAL
  Filled 2022-02-18 (×6): qty 1

## 2022-02-18 MED ORDER — ENSURE ENLIVE PO LIQD
237.0000 mL | Freq: Two times a day (BID) | ORAL | Status: DC
  Administered 2022-02-18 – 2022-02-20 (×3): 237 mL via ORAL

## 2022-02-18 MED ORDER — ENOXAPARIN SODIUM 30 MG/0.3ML IJ SOSY
30.0000 mg | PREFILLED_SYRINGE | Freq: Two times a day (BID) | INTRAMUSCULAR | Status: DC
Start: 2022-02-18 — End: 2022-02-21
  Administered 2022-02-18 – 2022-02-20 (×5): 30 mg via SUBCUTANEOUS
  Filled 2022-02-18 (×5): qty 0.3

## 2022-02-18 MED ORDER — METHOCARBAMOL 500 MG PO TABS
1000.0000 mg | ORAL_TABLET | Freq: Three times a day (TID) | ORAL | Status: DC
  Administered 2022-02-18 – 2022-02-20 (×7): 1000 mg via ORAL
  Filled 2022-02-18 (×8): qty 2

## 2022-02-18 MED ORDER — OXYCODONE HCL 5 MG PO TABS
5.0000 mg | ORAL_TABLET | ORAL | Status: DC | PRN
  Administered 2022-02-18: 10 mg via ORAL
  Administered 2022-02-19: 5 mg via ORAL
  Administered 2022-02-19 (×3): 10 mg via ORAL
  Administered 2022-02-19: 5 mg via ORAL
  Administered 2022-02-20: 10 mg via ORAL
  Administered 2022-02-20: 5 mg via ORAL
  Administered 2022-02-20 (×2): 10 mg via ORAL
  Filled 2022-02-18: qty 2
  Filled 2022-02-18: qty 1
  Filled 2022-02-18 (×3): qty 2
  Filled 2022-02-18 (×3): qty 1
  Filled 2022-02-18 (×3): qty 2

## 2022-02-18 MED ORDER — VITAMIN D 25 MCG (1000 UNIT) PO TABS
1000.0000 [IU] | ORAL_TABLET | Freq: Every day | ORAL | Status: DC
  Administered 2022-02-18 – 2022-02-20 (×3): 1000 [IU] via ORAL
  Filled 2022-02-18 (×3): qty 1

## 2022-02-18 MED ORDER — OXYCODONE HCL 5 MG/5ML PO SOLN: 5.0000 mg | ORAL | Status: DC | PRN

## 2022-02-18 MED ORDER — POTASSIUM PHOSPHATES 15 MMOLE/5ML IV SOLN
15.0000 mmol | Freq: Once | INTRAVENOUS | Status: AC
  Administered 2022-02-18: 15 mmol via INTRAVENOUS
  Filled 2022-02-18: qty 5

## 2022-02-18 MED ORDER — ACETAMINOPHEN 500 MG PO TABS
1000.0000 mg | ORAL_TABLET | Freq: Four times a day (QID) | ORAL | Status: DC
  Administered 2022-02-18 – 2022-02-20 (×10): 1000 mg via ORAL
  Filled 2022-02-18 (×10): qty 2

## 2022-02-18 MED ORDER — POTASSIUM CHLORIDE 10 MEQ/50ML IV SOLN
10.0000 meq | INTRAVENOUS | Status: AC
  Administered 2022-02-18 (×4): 10 meq via INTRAVENOUS
  Filled 2022-02-18 (×4): qty 50

## 2022-02-18 MED ORDER — KETOROLAC TROMETHAMINE 15 MG/ML IJ SOLN
30.0000 mg | Freq: Four times a day (QID) | INTRAMUSCULAR | Status: DC
  Administered 2022-02-18 – 2022-02-20 (×10): 30 mg via INTRAVENOUS
  Filled 2022-02-18 (×10): qty 2

## 2022-02-18 MED ORDER — POTASSIUM CHLORIDE 10 MEQ/50ML IV SOLN: 10.0000 meq | INTRAVENOUS | Status: DC

## 2022-02-18 MED ORDER — ACETAMINOPHEN 10 MG/ML IV SOLN
1000.0000 mg | Freq: Four times a day (QID) | INTRAVENOUS | Status: DC
Start: 2022-02-18 — End: 2022-02-18
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
## 2022-02-18 NOTE — Progress Notes (Signed)
Orthopedic Tech Progress Note Patient Details:  Jim [name] 1981-03-27 [id number]  Ortho Devices Type of Ortho Device: Lumbar corsett Ortho Device/Splint Interventions: Ordered   Post Interventions Patient Tolerated: Well Back braced dropped off with RN.  [place] A Sherrelle Prochazka 02/18/2022, 12:00 PM
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
## 2022-02-18 NOTE — Anesthesia Postprocedure Evaluation (Signed)
Anesthesia Post Note  Patient: Jim [name]  Procedure(s) Performed: OPEN REDUCTION  INTERNAL FIXATION (ORIF) ACETABULAR FRACTURE (Right: Hip)     Patient location during evaluation: PACU Anesthesia Type: General Level of consciousness: awake and alert Pain management: pain level controlled Vital Signs Assessment: post-procedure vital signs reviewed and stable Respiratory status: spontaneous breathing, nonlabored ventilation, respiratory function stable and patient connected to nasal cannula oxygen Cardiovascular status: blood pressure returned to baseline and stable Postop Assessment: no apparent nausea or vomiting Anesthetic complications: no   No notable events documented.      Shelton Silvas

## 2022-02-18 NOTE — Progress Notes (Signed)
Orthopaedic Trauma Progress Note  SUBJECTIVE: Doing okay this morning. Notes moderate, severe pain about operative site.  Asking for pain medication.  No chest pain. No SOB. No nausea/vomiting. No other complaints.   OBJECTIVE:  Vitals:   02/18/22 0600 02/18/22 0700  BP: 105/72 (!) 95/58  Pulse: 98 99  Resp: 18 19  Temp:    SpO2: 98% 94%    General: Sitting up in bed, no acute distress Respiratory: No increased work of breathing.  RLE: Hip dressing with mild/moderate serosanguineous drainage.  Knee immobilizer in place.  Tenderness about the hip as expected.  Able to wiggle toes.  Sensation intact DPN, SPN, TN. Compartment soft and compressible.  Brisk cap refill, warm to touch, DP 2+  IMAGING: Stable post op imaging.   LABS:  Results for orders placed or performed during the hospital encounter of 02/14/22 (from the past 24 hour(s))  Glucose, capillary     Status: Abnormal   Collection Time: 02/17/22 11:18 AM  Result Value Ref Range   Glucose-Capillary 105 (H) 70 - 99 mg/dL  Type and screen [facility]     Status: None (Preliminary result)   Collection Time: 02/17/22  2:10 PM  Result Value Ref Range   ABO/RH(D) B POS    Antibody Screen NEG    Sample Expiration      02/20/2022,2359 Performed at [facility], [address], [place], [place] [address]    Unit Number [id number]    Blood Component Type RBC LR PHER1    Unit division 00    Status of Unit ALLOCATED    Transfusion Status OK TO TRANSFUSE    Crossmatch Result Compatible    Unit Number [id number]    Blood Component Type RED CELLS,LR    Unit division 00    Status of Unit ALLOCATED    Transfusion Status OK TO TRANSFUSE    Crossmatch Result Compatible   ABO/Rh     Status: None   Collection Time: 02/17/22  2:15 PM  Result Value Ref Range   ABO/RH(D)      B POS Performed at [facility], [address] [place], [place] [address]   Prepare RBC (crossmatch)     Status: None    Collection Time: 02/17/22  2:21 PM  Result Value Ref Range   Order Confirmation      ORDER PROCESSED BY BLOOD BANK Performed at [facility], [address], [place], [place] [address]   Glucose, capillary     Status: Abnormal   Collection Time: 02/17/22  7:28 PM  Result Value Ref Range   Glucose-Capillary 125 (H) 70 - 99 mg/dL  Glucose, capillary     Status: None   Collection Time: 02/17/22 11:31 PM  Result Value Ref Range   Glucose-Capillary 94 70 - 99 mg/dL  Glucose, capillary     Status: Abnormal   Collection Time: 02/18/22  3:30 AM  Result Value Ref Range   Glucose-Capillary 101 (H) 70 - 99 mg/dL  CBC     Status: Abnormal   Collection Time: 02/18/22  4:15 AM  Result Value Ref Range   WBC 8.6 4.0 - 10.5 K/uL   RBC 3.14 (L) 4.22 - 5.81 MIL/uL   Hemoglobin 9.5 (L) 13.0 - 17.0 g/dL   HCT 84.1 (L) 32.4 - 40.1 %   MCV 88.5 80.0 - 100.0 fL   MCH 30.3 26.0 - 34.0 pg   MCHC 34.2 30.0 - 36.0 g/dL  RDW 12.6 11.5 - 15.5 %   Platelets 140 (L) 150 - 400 K/uL   nRBC 0.0 0.0 - 0.2 %  Basic metabolic panel     Status: Abnormal   Collection Time: 02/18/22  4:15 AM  Result Value Ref Range   Sodium 140 135 - 145 mmol/L   Potassium 3.3 (L) 3.5 - 5.1 mmol/L   Chloride 112 (H) 98 - 111 mmol/L   CO2 22 22 - 32 mmol/L   Glucose, Bld 103 (H) 70 - 99 mg/dL   BUN 6 6 - 20 mg/dL   Creatinine, Ser 2.94 (L) 0.61 - 1.24 mg/dL   Calcium 7.4 (L) 8.9 - 10.3 mg/dL   GFR, Estimated >76 >54 mL/min   Anion gap 6 5 - 15  Phosphorus     Status: None   Collection Time: 02/18/22  4:15 AM  Result Value Ref Range   Phosphorus 2.7 2.5 - 4.6 mg/dL    ASSESSMENT: EEAN BUSS is a 41 y.o. male, 1 Day Post-Op s/p ORIF RIGHT ACETABULAR FRACTURE  CV/Blood loss: Acute blood loss anemia, Hgb 9.5 this morning. Hemodynamically stable  PLAN: Weightbearing: TDWB RLE.   ROM: Posterior hip precautions  Incisional and dressing care: Reinforce as needed.  Plan to change dressing 02/19/2022 Showering: Okay to  begin showering getting incisions wet 02/20/2022 if no drainage from incisions Orthopedic device(s):  knee immobilizer RLE   Pain management:  1. Tylenol 1000 mg q 6 hours scheduled 2. Robaxin 1000 mg TID 3. Oxycodone 5-10 mg q 4 hours PRN 4. Morphine 2-4 mg q 2 hours PRN 5. Toradol 30 mg q 6 hours VTE prophylaxis:  Okay to start Lovenox once cleared by trauma and neurosurgery , SCDs ID:  Ancef 2gm post op Foley/Lines: Foley in place, removed today if able.  KVO IVFs Impediments to Fracture Healing: Vitamin D level 22, will start supplementation  Dispo: PT/OT evaluation today.  Plan to change dressing right hip tomorrow 02/19/2022. Will arrange prophylactic XRT right hip.  D/C recommendations: - Oxycodone and Robaxin for pain control - DOAC x30 days for DVT prophylaxis - Continue 1000 units Vit D supplementation x 90 days  Follow - up plan: 10 to 14 days after discharge for wound check, suture removal, repeat x-rays   Contact information:  Truitt Merle MD, Thyra Breed PA-C. After hours and holidays please check [email or web address] for group call information for [facility]   Thompson Caul, PA-C [phone] (office) [email or web address]
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
## 2022-02-18 NOTE — Progress Notes (Signed)
Contacted [facility] to arrange transfer to [facility] tomorrow before 1200 for radiation.  Spoke with Venia Minks scheduled for 1130. Please call [facility] with new room number if patient transfers prior to transport.

## 2022-02-18 NOTE — Progress Notes (Signed)
? ?  Inpatient Rehab Admissions Coordinator : ? ?Per therapy recommendations, patient was screened for CIR candidacy by Phoenyx Melka RN MSN.  At this time patient appears to be a potential candidate for CIR. I will place a rehab consult per protocol for full assessment. Please call me with any questions. ? ?Emily Massar RN MSN ?Admissions Coordinator ?[phone] ?  ?

## 2022-02-18 NOTE — Progress Notes (Signed)
Contacted [facility] to make aware of new room number.

## 2022-02-18 NOTE — Progress Notes (Addendum)
Inpatient Rehab Admissions Coordinator:     I spoke with Pt. And father Regarding potential CIR admit. They are interested and father can provide 24/7 support at d/c. I  discussed masking guidelines due to COVID concerns and they would like to admit when a bed is available. I will follow for potential CIR admit.   Megan Salon, MS, CCC-SLP Rehab Admissions Coordinator  [phone] (celll) [phone] (office)
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
## 2022-02-18 NOTE — Progress Notes (Signed)
Pt seen in room,alert/oriented in no apparent distress.menu guide provided with instructions. Pt verbalized understanding of instructions. Hospital valuables policy has been reviewed with no  complaints. Pt orientated to room/equipments. Bed in lowest position  with 3 side rails up, call bell/room phone within reach,and all wheels locked.
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
## 2022-02-19 ENCOUNTER — Ambulatory Visit
Admit: 2022-02-19 | Discharge: 2022-02-19 | Disposition: A | Discharge status: Home / Self Care | Payer: Medicaid Other | Source: Ambulatory Visit | Attending: Radiation Oncology | Admitting: Radiation Oncology

## 2022-02-19 ENCOUNTER — Ambulatory Visit
Admit: 2022-02-19 | Discharge: 2022-02-19 | Disposition: A | Discharge status: Home / Self Care | Payer: Medicaid Other | Attending: Radiation Oncology | Admitting: Radiation Oncology

## 2022-02-19 ENCOUNTER — Encounter (HOSPITAL_COMMUNITY): Payer: Self-pay

## 2022-02-19 ENCOUNTER — Other Ambulatory Visit: Payer: Self-pay

## 2022-02-19 DIAGNOSIS — S32421A Displaced fracture of posterior wall of right acetabulum, initial encounter for closed fracture: Secondary | ICD-10-CM

## 2022-02-19 LAB — BASIC METABOLIC PANEL
Anion gap: 7 (ref 5–15)
BUN: 7 mg/dL (ref 6–20)
CO2: 24 mmol/L (ref 22–32)
Calcium: 7.8 mg/dL — ABNORMAL LOW (ref 8.9–10.3)
Chloride: 110 mmol/L (ref 98–111)
Creatinine, Ser: 0.7 mg/dL (ref 0.61–1.24)
GFR, Estimated: >60 mL/min (ref >60)
Glucose, Bld: 117 mg/dL — ABNORMAL HIGH (ref 70–99)
Potassium: 3.2 mmol/L — ABNORMAL LOW (ref 3.5–5.1)
Sodium: 141 mmol/L (ref 135–145)

## 2022-02-19 LAB — CBC
HCT: 26.9 % — ABNORMAL LOW (ref 39.0–52.0)
Hemoglobin: 9.1 g/dL — ABNORMAL LOW (ref 13.0–17.0)
MCH: 29.9 pg (ref 26.0–34.0)
MCHC: 33.8 g/dL (ref 30.0–36.0)
MCV: 88.5 fL (ref 80.0–100.0)
Platelets: 156 10*3/uL (ref 150–400)
RBC: 3.04 MIL/uL — ABNORMAL LOW (ref 4.22–5.81)
RDW: 12.8 % (ref 11.5–15.5)
WBC: 7 10*3/uL (ref 4.0–10.5)
nRBC: 0 % (ref 0.0–0.2)

## 2022-02-19 LAB — MAGNESIUM: Magnesium: 1.8 mg/dL (ref 1.7–2.4)

## 2022-02-19 MED ORDER — POTASSIUM CHLORIDE 20 MEQ PO PACK
40.0000 meq | PACK | Freq: Two times a day (BID) | ORAL | Status: AC
Start: 2022-02-19 — End: 2022-02-19
  Administered 2022-02-19 (×2): 40 meq via ORAL
  Filled 2022-02-19 (×2): qty 2

## 2022-02-19 MED ORDER — MORPHINE SULFATE (PF) 2 MG/ML IV SOLN
2.0000 mg | INTRAVENOUS | Status: DC | PRN
  Administered 2022-02-19 – 2022-02-20 (×3): 2 mg via INTRAVENOUS
  Filled 2022-02-19 (×3): qty 1

## 2022-02-19 NOTE — Progress Notes (Signed)
Orthopaedic Trauma Progress Note  SUBJECTIVE: Doing okay this morning, sleepy on exam. Awakens easily, now eating breakfast. Pain in right hip controlled with current meds. No chest pain. No SOB. No nausea/vomiting. No other complaints. Patient scheduled for XRT R hip at 12:15pm today at [facility].  OBJECTIVE:  Vitals:   02/19/22 0612 02/19/22 0805  BP: 126/84 (!) 136/95  Pulse: 81 90  Resp: (!) 22 17  Temp:  99 F (37.2 C)  SpO2: 94% 95%    General: Resting in bed with his eyes closed, no acute distress Respiratory: No increased work of breathing.  RLE: Hip dressing removed, incision CDI. No active drainage. Aquacel dressing applied. Knee immobilizer in place.  Tenderness about the hip as expected.  Able to wiggle toes.  Sensation intact DPN, SPN, TN. Compartment soft and compressible.  Brisk cap refill, warm to touch, DP 2+  IMAGING: Stable post op imaging.   LABS:  Results for orders placed or performed during the hospital encounter of 02/14/22 (from the past 24 hour(s))  CBC     Status: Abnormal   Collection Time: 02/19/22  4:09 AM  Result Value Ref Range   WBC 7.0 4.0 - 10.5 K/uL   RBC 3.04 (L) 4.22 - 5.81 MIL/uL   Hemoglobin 9.1 (L) 13.0 - 17.0 g/dL   HCT 58.5 (L) 27.7 - 82.4 %   MCV 88.5 80.0 - 100.0 fL   MCH 29.9 26.0 - 34.0 pg   MCHC 33.8 30.0 - 36.0 g/dL   RDW 23.5 36.1 - 44.3 %   Platelets 156 150 - 400 K/uL   nRBC 0.0 0.0 - 0.2 %  Basic metabolic panel     Status: Abnormal   Collection Time: 02/19/22  4:09 AM  Result Value Ref Range   Sodium 141 135 - 145 mmol/L   Potassium 3.2 (L) 3.5 - 5.1 mmol/L   Chloride 110 98 - 111 mmol/L   CO2 24 22 - 32 mmol/L   Glucose, Bld 117 (H) 70 - 99 mg/dL   BUN 7 6 - 20 mg/dL   Creatinine, Ser 1.54 0.61 - 1.24 mg/dL   Calcium 7.8 (L) 8.9 - 10.3 mg/dL   GFR, Estimated >00 >86 mL/min   Anion gap 7 5 - 15    ASSESSMENT: Jim [name] is a 41 y.o. male, 2 Days Post-Op s/p ORIF RIGHT ACETABULAR FRACTURE  CV/Blood loss: Acute blood  loss anemia, Hgb 9.1 this morning. Hemodynamically stable  PLAN: Weightbearing: TDWB RLE.   ROM: Posterior hip precautions  Incisional and dressing care: Changed today. Leave in place until follow-up Showering: Okay to begin showering. Aquacel dressing may get wet Orthopedic device(s):  knee immobilizer RLE   Pain management:  1. Tylenol 1000 mg q 6 hours scheduled 2. Robaxin 1000 mg TID 3. Oxycodone 5-10 mg q 4 hours PRN 4. Morphine 2-4 mg q 2 hours PRN 5. Toradol 30 mg q 6 hours VTE prophylaxis: Lovenox, SCDs ID:  Ancef 2gm post op Foley/Lines: No foley.  KVO IVFs Impediments to Fracture Healing: Vitamin D level 22, will start supplementation  Dispo: Therapies as tolerated, PT/OT recommending CIR.   D/C recommendations: - Oxycodone and Robaxin for pain control - DOAC x30 days for DVT prophylaxis - Continue 1000 units Vit D supplementation x 90 days  Follow - up plan: 10 to 14 days after discharge for wound check, suture removal, repeat x-rays   Contact information:  Truitt Merle MD, Thyra Breed PA-C. After hours and holidays please check  [email or web address] for group call information for [facility]   Thompson Caul, PA-C [phone] (office) [email or web address]

## 2022-02-19 NOTE — Progress Notes (Signed)
  Radiation Oncology         ([phone]) [phone] ________________________________  [id number] Jim [name]  INPATIENT  SIMULATION AND TREATMENT PLANNING NOTE    ICD-9-CM ICD-10-CM   1. Acetabular fracture 905.1 S32.402S     DIAGNOSIS:  Risk for heterotopic ossification  NARRATIVE:  The patient was brought to the linac treatment suite.  Identity was confirmed.  All relevant records and images related to the planned course of therapy were reviewed.  The patient freely provided informed written consent to proceed with treatment after reviewing the details related to the planned course of therapy. The consent form was witnessed and verified by the staff.  Then, the patient was set-up in a stable reproducible  supine position for radiation therapy.  Planning images were obtained.  The images were used to design fields and complete a dose calculation in the planning software.  The radiation prescription was entered and confirmed.    I have requested : a simple dose calculation.    PLAN:  The patient was set up to receive 7 Gy in 1 fraction.  ________________________________  Artist Pais Kathrynn Running, M.D.

## 2022-02-19 NOTE — Progress Notes (Signed)
PT Cancellation Note  Patient Details Name: JEFF FRIEDEN MRN: [id number] DOB: Dec 12, 1980   Cancelled Treatment:    Reason Eval/Treat Not Completed: Patient at procedure or test/unavailable At [facility] for XRT. Will follow up as schedule allows.   Levert Heslop A. Dan Humphreys PT, DPT Acute Rehabilitation Services Office [phone]    Viviann Spare 02/19/2022, 12:12 PM

## 2022-02-19 NOTE — Progress Notes (Signed)
IP rehab admissions - I have a bed on inpatient rehab here at [facility] and will plan to admit to CIR today after XRT at [facility] is completed.  Call me for questions.  [phone]
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
## 2022-02-19 NOTE — Progress Notes (Signed)
SLP Cancellation Note  Patient Details Name: Jim [name] MRN: [id number] DOB: 1981-03-13   Cancelled treatment:        Attempted to see pt for diet tolerance follow up.  Pt off flor for XRT.  Pt with plans to admit to CIR following treatment.  SLP to follow at next level of care for dysphagia management.  Consider cognitive-linguistic assessment at CIR if indicated.   Kerrie Pleasure, MA, CCC-SLP Acute Rehabilitation Services Office: [phone]  02/19/2022, 11:01 AM

## 2022-02-19 NOTE — Progress Notes (Signed)
IP rehab admissions - I am holding admission to inpatient rehab for today.  I have updated Jim [name] and will follow up again tomorrow for potential bed availability.  Call me for questions.  [phone]

## 2022-02-20 LAB — BASIC METABOLIC PANEL
Anion gap: 8 (ref 5–15)
BUN: <5 mg/dL — ABNORMAL LOW (ref 6–20)
CO2: 22 mmol/L (ref 22–32)
Calcium: 8 mg/dL — ABNORMAL LOW (ref 8.9–10.3)
Chloride: 110 mmol/L (ref 98–111)
Creatinine, Ser: 0.67 mg/dL (ref 0.61–1.24)
GFR, Estimated: >60 mL/min (ref >60)
Glucose, Bld: 102 mg/dL — ABNORMAL HIGH (ref 70–99)
Potassium: 3.6 mmol/L (ref 3.5–5.1)
Sodium: 140 mmol/L (ref 135–145)

## 2022-02-20 LAB — CBC
HCT: 27.8 % — ABNORMAL LOW (ref 39.0–52.0)
Hemoglobin: 9.1 g/dL — ABNORMAL LOW (ref 13.0–17.0)
MCH: 29.3 pg (ref 26.0–34.0)
MCHC: 32.7 g/dL (ref 30.0–36.0)
MCV: 89.4 fL (ref 80.0–100.0)
Platelets: 176 10*3/uL (ref 150–400)
RBC: 3.11 MIL/uL — ABNORMAL LOW (ref 4.22–5.81)
RDW: 12.5 % (ref 11.5–15.5)
WBC: 7.9 10*3/uL (ref 4.0–10.5)
nRBC: 0 % (ref 0.0–0.2)
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
## 2022-02-20 NOTE — Plan of Care (Signed)
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
## 2022-02-20 NOTE — Progress Notes (Signed)
Mobility Specialist Criteria Algorithm Info.   02/20/22 1305  Pain Assessment  Pain Score 4  Pain Location R hip  Pain Descriptors / Indicators Dull;Grimacing  Pain Intervention(s) Limited activity within patient's tolerance  Mobility  Activity Ambulated with assistance in hallway  Range of Motion/Exercises Active;All extremities  Level of Assistance Contact guard assist, steadying assist  Assistive Device Front wheel walker  RLE Weight Bearing TWB  Distance Ambulated (ft) 100 ft  Activity Response Tolerated well   Patient received lying in prone agreeable to participate in mobility. Could not recall any precautions and needed re-education on back, hip, and WB precautions. Was independent for bed mobility and stood with cues for hand/foot placement. Ambulated in hallway and to bathroom min guard with steady gait. Required max cues to adhere to TWB precautions although pt stated he wasn't bearing much weight on RLE. Returned to room without complaint or incident. Was left dangling EOB with all needs met and OT present.   02/20/2022 4:05 PM  Martin[place] Ruari Duggan, [place], [place]  LYYTK:[phone] Office: [phone]

## 2022-02-20 NOTE — Plan of Care (Signed)
  Problem: Education: Goal: Knowledge of General Education information will improve Description: Including pain rating scale, medication(s)/side effects and non-pharmacologic comfort measures Outcome: Progressing   Problem: Health Behavior/Discharge Planning: Goal: Ability to manage health-related needs will improve Outcome: Progressing   Problem: Clinical Measurements: Goal: Ability to maintain clinical measurements within normal limits will improve Outcome: Progressing   Problem: Nutritional: Goal: Maintenance of adequate nutrition will improve Outcome: Progressing
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
## 2022-02-20 NOTE — Progress Notes (Addendum)
Pt wants to stand up and move around. Pt told that he would have to wait until staff was available to assist. Pt non compliant at first. Told pt would be back within the hour w/other staff.    Pt walked around room multiple times w/walker.  Pt is satisfied and went back to sleep. Pts whole bedding changed.   0430 Pt noncompliant w/trying to get out of bed.  Pt calling out multiple times for pain meds.

## 2022-02-21 LAB — TYPE AND SCREEN
ABO/RH(D): B POS
Antibody Screen: NEGATIVE
Unit division: 0
Unit division: 0

## 2022-02-21 LAB — BPAM RBC
Blood Product Expiration Date: 202308082359
Blood Product Expiration Date: 202308082359
Unit Type and Rh: 7300
Unit Type and Rh: 7300

## 2022-02-21 NOTE — Progress Notes (Signed)
This patient stated that he would like to leave because "he is tired of staying in the hospital". Patient was educated on the risk of leaving AMA and was asked to reconsider. The patient continued to say "he is leaving". Kris Mouton, MD was made aware of the situation. The AMA form was signed by the patient and witnessed by this nurse. The patient stated he had no belongings and left the unit. The Director of the unit was also made aware of the situation.

## 2022-02-21 NOTE — Progress Notes (Signed)
Patient leaving AMA. PICC line removed intact per protocol. Instructed patient about need to lie in bed recumbent x 30 minutes post PICC removal. Pt refused.
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
